# Patient Record
Sex: Male | Born: 1990 | Race: White | Hispanic: No | Marital: Married | Smoking: Never smoker
Health system: Southern US, Community
[De-identification: ages and names within clinical notes are randomized; demographics above are authoritative.]

## PROBLEM LIST (undated history)

## (undated) HISTORY — PX: INNER EAR SURGERY: SHX679

## (undated) HISTORY — PX: WISDOM TOOTH EXTRACTION: SHX21

---
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
## 2009-04-14 ENCOUNTER — Ambulatory Visit: Payer: Self-pay | Admitting: Family Medicine

## 2009-04-14 ENCOUNTER — Encounter: Payer: Self-pay | Admitting: Family Medicine

## 2009-04-14 DIAGNOSIS — R5381 Other malaise: Secondary | ICD-10-CM

## 2009-04-14 DIAGNOSIS — R5383 Other fatigue: Secondary | ICD-10-CM

## 2009-04-14 LAB — CONVERTED CEMR LAB
ALT: 15 units/L (ref 0–53)
AST: 17 units/L (ref 0–37)
Albumin: 4.6 g/dL (ref 3.5–5.2)
Calcium: 9.2 mg/dL (ref 8.4–10.5)
Chloride: 105 meq/L (ref 96–112)
Creatinine, Ser: 1.05 mg/dL (ref 0.40–1.50)
Heterophile Ab Screen: NEGATIVE
Lymphocytes Relative: 33 % (ref 12–46)
Lymphs Abs: 2.1 10*3/uL (ref 0.7–4.0)
Monocytes Relative: 10 % (ref 3–12)
Neutro Abs: 3.4 10*3/uL (ref 1.7–7.7)
Neutrophils Relative %: 53 % (ref 43–77)
Platelets: 250 10*3/uL (ref 150–400)
Potassium: 3.9 meq/L (ref 3.5–5.3)
RBC: 4.8 M/uL (ref 4.22–5.81)
Sodium: 140 meq/L (ref 135–145)
Total Protein: 6.9 g/dL (ref 6.0–8.3)
WBC: 6.4 10*3/uL (ref 4.0–10.5)

## 2010-02-08 NOTE — Assessment & Plan Note (Signed)
Summary: sore throat/cough/gums bleeding/eo   Vital Signs:  Patient profile:   20 year old male Weight:      141 pounds Temp:     97.8 degrees F oral Pulse rate:   93 / minute BP sitting:   129 / 75  (left arm)  Vitals Entered By: Renato Battles slade,cma CC: cough and congestion. fatigue over the last 2 weeks. tired x 1 month. bleeding gums x 3 weeks. Is Patient Diabetic? No Pain Assessment Patient in pain? no        Primary Care Provider:  Enid Baas MD  CC:  cough and congestion. fatigue over the last 2 weeks. tired x 1 month. bleeding gums x 3 weeks.Marland Kitchen  History of Present Illness: Sick for 2 weeks, with cough, congestion, sore throat, fatigue and bleeding gums.  Deneis fever or chills. Goes to [employer] full time, in a fraturnity, works double shifts.  Visits the dentist every 6 months, teeth are in good repair.  Deneis smoking pot, smokes cigs and does drink beer.    Habits & Providers  Alcohol-Tobacco-Diet     Tobacco Status: current  Comments: sometimes.   Current Medications (verified): 1)  Amoxicillin 500 Mg Caps (Amoxicillin) .... One Tab Three Times A Day For 7 Days 2)  Hydromet 5-1.5 Mg/33ml Syrp (Hydrocodone-Homatropine) .... One Teaspoonful Three Times A Day As Needed For Severe Cough, 100 Cc  Allergies (verified): No Known Drug Allergies  Social History: Smoking Status:  current  Review of Systems General:  Complains of fatigue and loss of appetite; denies chills, fever, sweats, and weight loss. ENT:  Complains of nasal congestion, postnasal drainage, and sore throat. CV:  Denies chest pain or discomfort and difficulty breathing at night. Resp:  Complains of cough; denies shortness of breath and wheezing. GI:  Denies abdominal pain, diarrhea, nausea, and vomiting.  Physical Exam  General:  Well developed, alert, congested. Ears:  TM pink and retracted Nose:  inflammed Mouth:  reddened, non-exudative tonsils Neck:  no adenopathy Lungs:  normal respiratory  effort and normal breath sounds.   Heart:  normal rate and regular rhythm.   Abdomen:  soft, non-tender, normal bowel sounds, no hepatomegaly, and no splenomegaly.   Skin:  no rashes Cervical Nodes:  No lymphadenopathy noted Axillary Nodes:  No palpable lymphadenopathy   Impression & Recommendations:  Problem # 1:  FATIGUE (ICD-780.79) negative monospot, likely prolong viral illness with secondary infection in young man with a very busy life.  Will check labs. Orders: CBC w/Diff-[facility] (16109) Comp Met-[facility] (60454-09811) [facility]- Est  Level 4 (91478)  Problem # 2:  SINUSITIS, ACUTE (ICD-461.9)  His updated medication list for this problem includes:    Amoxicillin 500 Mg Caps (Amoxicillin) ..... One tab three times a day for 7 days    Hydromet 5-1.5 Mg/41ml Syrp (Hydrocodone-homatropine) ..... One teaspoonful three times a day as needed for severe cough, 100 cc  Orders: [facility]- Est  Level 4 (29562)  Problem # 3:  BLEEDING GUMS (ICD-523.8) likely gingivits, check for thrombocytopenia Orders: [facility]- Est  Level 4 (13086)  Complete Medication List: 1)  Amoxicillin 500 Mg Caps (Amoxicillin) .... One tab three times a day for 7 days 2)  Hydromet 5-1.5 Mg/48ml Syrp (Hydrocodone-homatropine) .... One teaspoonful three times a day as needed for severe cough, 100 cc  Other Orders: Monospot-[facility] (57846)  Patient Instructions: 1)  [id number] 2)  [id number] Text Mat 3)  Mono was negative 4)  Return as needed 5)  Use Listerine for bleeding gums-  6)  I will call with labs  Prescriptions: HYDROMET 5-1.5 MG/5ML SYRP (HYDROCODONE-HOMATROPINE) one teaspoonful three times a day as needed for severe cough, 100 cc Brand medically necessary #1 x 0   Entered and Authorized by:   Luretha Murphy NP   Signed by:   Luretha Murphy NP on 04/14/2009   Method used:   Print then Give to Patient   RxID:   [id number] AMOXICILLIN 500 MG CAPS (AMOXICILLIN) one tab three times a day for 7 days Brand medically  necessary #21 x 0   Entered and Authorized by:   Luretha Murphy NP   Signed by:   Luretha Murphy NP on 04/14/2009   Method used:   Print then Give to Patient   RxID:   [id number]   Laboratory Results   Blood Tests   Date/Time Received: April 14, 2009 10:26 AM  Date/Time Reported: April 14, 2009 10:41 AM    Mono: negative Comments: ...........test performed by...........Marland KitchenTerese Door, CMA

## 2019-04-23 ENCOUNTER — Ambulatory Visit: Payer: Self-pay | Attending: Internal Medicine

## 2019-04-23 DIAGNOSIS — Z23 Encounter for immunization: Secondary | ICD-10-CM

## 2019-05-17 ENCOUNTER — Ambulatory Visit: Payer: Self-pay | Attending: Internal Medicine

## 2019-05-17 DIAGNOSIS — Z23 Encounter for immunization: Secondary | ICD-10-CM

## 2019-05-17 NOTE — Progress Notes (Signed)
   Covid-19 Vaccination [facility]  Name:  Cory Powell    MRN: [id number] DOB: 01-04-1991  05/17/2019  [name] was observed post Covid-19 immunization for 15 minutes without incident. He was provided with Vaccine Information Sheet and instruction to access the V-Safe system.   [name] was instructed to call 911 with any severe reactions post vaccine: Marland Kitchen Difficulty breathing  . Swelling of face and throat  . A fast heartbeat  . A bad rash all over body  . Dizziness and weakness   Immunizations Administered    Name Date Dose VIS Date Route   Pfizer COVID-19 Vaccine 05/17/2019  2:47 PM 0.3 mL 03/03/2018 Intramuscular   Manufacturer: [place]   Lot: [id number]   [place]: [id number]

## 2019-07-16 ENCOUNTER — Other Ambulatory Visit: Payer: Self-pay

## 2019-07-16 ENCOUNTER — Encounter: Payer: Self-pay | Admitting: Family Medicine

## 2019-07-16 ENCOUNTER — Ambulatory Visit (INDEPENDENT_AMBULATORY_CARE_PROVIDER_SITE_OTHER): Payer: 59 | Admitting: Family Medicine

## 2019-07-16 VITALS — BP 146/69 | HR 84 | Ht 66.5 in | Wt 173.0 lb

## 2019-07-16 DIAGNOSIS — Z23 Encounter for immunization: Secondary | ICD-10-CM

## 2019-07-16 DIAGNOSIS — Z13220 Encounter for screening for lipoid disorders: Secondary | ICD-10-CM

## 2019-07-16 DIAGNOSIS — Z0000 Encounter for general adult medical examination without abnormal findings: Secondary | ICD-10-CM | POA: Insufficient documentation

## 2019-07-16 NOTE — Assessment & Plan Note (Signed)
Well adult Orders Placed This Encounter  Procedures  . Tdap vaccine greater than or equal to 30yo IM  . COMPLETE METABOLIC PANEL WITH GFR  . CBC  . Lipid Profile  Screening: Lipid Immunizations: Updated Tdap given Anticipatory guidance/Risk factor reduction:  Recommendations per AVS

## 2019-07-16 NOTE — Progress Notes (Signed)
Cory Powell - 29 y.o. male MRN [id number]  Date of birth: 09/01/1990  Subjective Chief Complaint  Patient presents with  . Establish Care    HPI Cory Powell is a 29 y.o. male here today for initial visit and annual exam.  He has been in good health.  He does not take any chronic medications.  He does use marijuana 2-3x per month.  He denies use of nicotine products.  He consumes a couple of servings of EtOH on the weekend.  He exercises a few days per week.  Feels like diet could be improved.  Some stress related to work but handling ok.    Father had colon cancer diagnosed at age 51.  No other cancers or significant medical problems in the family.   Review of Systems  Constitutional: Negative for chills, fever, malaise/fatigue and weight loss.  HENT: Negative for congestion, ear pain and sore throat.   Eyes: Negative for blurred vision, double vision and pain.  Respiratory: Negative for cough and shortness of breath.   Cardiovascular: Negative for chest pain and palpitations.  Gastrointestinal: Negative for abdominal pain, blood in stool, constipation, heartburn and nausea.  Genitourinary: Negative for dysuria and urgency.  Musculoskeletal: Negative for joint pain and myalgias.  Neurological: Negative for dizziness and headaches.  Endo/Heme/Allergies: Does not bruise/bleed easily.  Psychiatric/Behavioral: Negative for depression. The patient is not nervous/anxious and does not have insomnia.     No Known Allergies  History reviewed. No pertinent past medical history.  History reviewed. No pertinent surgical history.  Social History   Socioeconomic History  . Marital status: Married    Spouse name: Not on file  . Number of children: Not on file  . Years of education: Not on file  . Highest education level: Not on file  Occupational History  . Occupation: Herbalist: [employer]  Tobacco Use  . Smoking status: Never Smoker  . Smokeless tobacco: Never Used   Vaping Use  . Vaping Use: Never used  Substance and Sexual Activity  . Alcohol use: Yes    Comment: 23- drinks per week  . Drug use: Yes    Types: Marijuana    Comment: 2x per month  . Sexual activity: Yes  Other Topics Concern  . Not on file  Social History Narrative  . Not on file   Social Determinants of Health   Financial Resource Strain:   . Difficulty of Paying Living Expenses:   Food Insecurity:   . Worried About Charity fundraiser in the Last Year:   . Arboriculturist in the Last Year:   Transportation Needs:   . Film/video editor (Medical):   Marland Kitchen Lack of Transportation (Non-Medical):   Physical Activity:   . Days of Exercise per Week:   . Minutes of Exercise per Session:   Stress:   . Feeling of Stress :   Social Connections:   . Frequency of Communication with Friends and Family:   . Frequency of Social Gatherings with Friends and Family:   . Attends Religious Services:   . Active Member of Clubs or Organizations:   . Attends Archivist Meetings:   Marland Kitchen Marital Status:     Family History  Problem Relation Age of Onset  . Colon cancer Father 103    Health Maintenance  Topic Date Due  . Hepatitis C Screening  Never done  . HIV Screening  Never done  . INFLUENZA VACCINE  08/08/2019  .  TETANUS/TDAP  07/15/2029  . COVID-19 Vaccine  Completed     ----------------------------------------------------------------------------------------------------------------------------------------------------------------------------------------------------------------- Physical Exam BP (!) 146/69   Pulse 84   Ht 5' 6.5" (1.689 m)   Wt 173 lb (78.5 kg)   SpO2 (!) 84%   BMI 27.50 kg/m   Physical Exam Constitutional:      General: He is not in acute distress. HENT:     Head: Normocephalic and atraumatic.     Right Ear: External ear normal.     Left Ear: External ear normal.  Eyes:     General: No scleral icterus. Neck:     Thyroid: No thyromegaly.   Cardiovascular:     Rate and Rhythm: Normal rate and regular rhythm.     Heart sounds: Normal heart sounds.  Pulmonary:     Effort: Pulmonary effort is normal.     Breath sounds: Normal breath sounds.  Abdominal:     General: Bowel sounds are normal. There is no distension.     Palpations: Abdomen is soft.     Tenderness: There is no abdominal tenderness. There is no guarding.  Musculoskeletal:     Cervical back: Normal range of motion.  Lymphadenopathy:     Cervical: No cervical adenopathy.  Skin:    General: Skin is warm and dry.     Findings: No rash.  Neurological:     Mental Status: He is alert and oriented to person, place, and time.     Cranial Nerves: No cranial nerve deficit.     Motor: No abnormal muscle tone.  Psychiatric:        Mood and Affect: Mood normal.        Behavior: Behavior normal.     ------------------------------------------------------------------------------------------------------------------------------------------------------------------------------------------------------------------- Assessment and Plan  Well adult exam Well adult Orders Placed This Encounter  Procedures  . Tdap vaccine greater than or equal to 7yo IM  . COMPLETE METABOLIC PANEL WITH GFR  . CBC  . Lipid Profile  Screening: Lipid Immunizations: Updated Tdap given Anticipatory guidance/Risk factor reduction:  Recommendations per AVS   No orders of the defined types were placed in this encounter.   No follow-ups on file.    This visit occurred during the SARS-CoV-2 public health emergency.  Safety protocols were in place, including screening questions prior to the visit, additional usage of staff PPE, and extensive cleaning of exam room while observing appropriate contact time as indicated for disinfecting solutions.

## 2019-07-16 NOTE — Patient Instructions (Signed)
Great to meet you! Stop in to have labs completed in the next couple of weeks.  Follow up as needed.    Preventive Care 95-30 Years Old, Male Preventive care refers to lifestyle choices and visits with your health care provider that can promote health and wellness. This includes:  A yearly physical exam. This is also called an annual well check.  Regular dental and eye exams.  Immunizations.  Screening for certain conditions.  Healthy lifestyle choices, such as eating a healthy diet, getting regular exercise, not using drugs or products that contain nicotine and tobacco, and limiting alcohol use. What can I expect for my preventive care visit? Physical exam Your health care provider will check:  Height and weight. These may be used to calculate body mass index (BMI), which is a measurement that tells if you are at a healthy weight.  Heart rate and blood pressure.  Your skin for abnormal spots. Counseling Your health care provider may ask you questions about:  Alcohol, tobacco, and drug use.  Emotional well-being.  Home and relationship well-being.  Sexual activity.  Eating habits.  Work and work Statistician. What immunizations do I need?  Influenza (flu) vaccine  This is recommended every year. Tetanus, diphtheria, and pertussis (Tdap) vaccine  You may need a Td booster every 10 years. Varicella (chickenpox) vaccine  You may need this vaccine if you have not already been vaccinated. Human papillomavirus (HPV) vaccine  If recommended by your health care provider, you may need three doses over 6 months. Measles, mumps, and rubella (MMR) vaccine  You may need at least one dose of MMR. You may also need a second dose. Meningococcal conjugate (MenACWY) vaccine  One dose is recommended if you are 60-44 years old and a Market researcher living in a residence hall, or if you have one of several medical conditions. You may also need additional booster  doses. Pneumococcal conjugate (PCV13) vaccine  You may need this if you have certain conditions and were not previously vaccinated. Pneumococcal polysaccharide (PPSV23) vaccine  You may need one or two doses if you smoke cigarettes or if you have certain conditions. Hepatitis A vaccine  You may need this if you have certain conditions or if you travel or work in places where you may be exposed to hepatitis A. Hepatitis B vaccine  You may need this if you have certain conditions or if you travel or work in places where you may be exposed to hepatitis B. Haemophilus influenzae type b (Hib) vaccine  You may need this if you have certain risk factors. You may receive vaccines as individual doses or as more than one vaccine together in one shot (combination vaccines). Talk with your health care provider about the risks and benefits of combination vaccines. What tests do I need? Blood tests  Lipid and cholesterol levels. These may be checked every 5 years starting at age 36.  Hepatitis C test.  Hepatitis B test. Screening   Diabetes screening. This is done by checking your blood sugar (glucose) after you have not eaten for a while (fasting).  Sexually transmitted disease (STD) testing. Talk with your health care provider about your test results, treatment options, and if necessary, the need for more tests. Follow these instructions at home: Eating and drinking   Eat a diet that includes fresh fruits and vegetables, whole grains, lean protein, and low-fat dairy products.  Take vitamin and mineral supplements as recommended by your health care provider.  Do not drink  alcohol if your health care provider tells you not to drink.  If you drink alcohol: ? Limit how much you have to 0-2 drinks a day. ? Be aware of how much alcohol is in your drink. In the [place] one drink equals one 12 oz bottle of beer (355 mL), one 5 oz glass of wine (148 mL), or one 1 oz glass of hard liquor (44  mL). Lifestyle  Take daily care of your teeth and gums.  Stay active. Exercise for at least 30 minutes on 5 or more days each week.  Do not use any products that contain nicotine or tobacco, such as cigarettes, e-cigarettes, and chewing tobacco. If you need help quitting, ask your health care provider.  If you are sexually active, practice safe sex. Use a condom or other form of protection to prevent STIs (sexually transmitted infections). What's next?  Go to your health care provider once a year for a well check visit.  Ask your health care provider how often you should have your eyes and teeth checked.  Stay up to date on all vaccines. This information is not intended to replace advice given to you by your health care provider. Make sure you discuss any questions you have with your health care provider. Document Revised: 12/18/2017 Document Reviewed: 12/18/2017 Elsevier Patient Education  2020 [employer].

## 2019-09-24 ENCOUNTER — Encounter: Payer: Self-pay | Admitting: Family Medicine

## 2019-09-24 ENCOUNTER — Ambulatory Visit (INDEPENDENT_AMBULATORY_CARE_PROVIDER_SITE_OTHER): Payer: 59

## 2019-09-24 ENCOUNTER — Ambulatory Visit (INDEPENDENT_AMBULATORY_CARE_PROVIDER_SITE_OTHER): Payer: 59 | Admitting: Family Medicine

## 2019-09-24 ENCOUNTER — Other Ambulatory Visit: Payer: Self-pay

## 2019-09-24 VITALS — BP 130/77 | HR 85 | Wt 167.1 lb

## 2019-09-24 DIAGNOSIS — N50812 Left testicular pain: Secondary | ICD-10-CM | POA: Diagnosis not present

## 2019-09-24 DIAGNOSIS — N5089 Other specified disorders of the male genital organs: Secondary | ICD-10-CM

## 2019-09-24 NOTE — Progress Notes (Signed)
Cory Powell - 29 y.o. male MRN [id number]  Date of birth: 1990-05-29  Subjective No chief complaint on file.   HPI Cory Powell is a 29 y.o. male here today with complaint of testicular nodule and pain.  He noticed this about 2 weeks ago.  Area is somewhat tender to palpation.  He denies any known trauma.  He has not had pain with urination, ejaculation or erections.  He denies penile discharge.  He has not had cough, shortness of breath or weight loss.   ROS:  A comprehensive ROS was completed and negative except as noted per HPI  No Known Allergies  No past medical history on file.  No past surgical history on file.  Social History   Socioeconomic History  . Marital status: Married    Spouse name: Not on file  . Number of children: Not on file  . Years of education: Not on file  . Highest education level: Not on file  Occupational History  . Occupation: Herbalist: [employer]  Tobacco Use  . Smoking status: Never Smoker  . Smokeless tobacco: Never Used  Vaping Use  . Vaping Use: Never used  Substance and Sexual Activity  . Alcohol use: Yes    Comment: 23- drinks per week  . Drug use: Yes    Types: Marijuana    Comment: 2x per month  . Sexual activity: Yes  Other Topics Concern  . Not on file  Social History Narrative  . Not on file   Social Determinants of Health   Financial Resource Strain:   . Difficulty of Paying Living Expenses: Not on file  Food Insecurity:   . Worried About Charity fundraiser in the Last Year: Not on file  . Ran Out of Food in the Last Year: Not on file  Transportation Needs:   . Lack of Transportation (Medical): Not on file  . Lack of Transportation (Non-Medical): Not on file  Physical Activity:   . Days of Exercise per Week: Not on file  . Minutes of Exercise per Session: Not on file  Stress:   . Feeling of Stress : Not on file  Social Connections:   . Frequency of Communication with Friends and Family: Not on  file  . Frequency of Social Gatherings with Friends and Family: Not on file  . Attends Religious Services: Not on file  . Active Member of Clubs or Organizations: Not on file  . Attends Archivist Meetings: Not on file  . Marital Status: Not on file    Family History  Problem Relation Age of Onset  . Colon cancer Father 79    Health Maintenance  Topic Date Due  . Hepatitis C Screening  Never done  . HIV Screening  Never done  . INFLUENZA VACCINE  Never done  . TETANUS/TDAP  07/15/2029  . COVID-19 Vaccine  Completed     ----------------------------------------------------------------------------------------------------------------------------------------------------------------------------------------------------------------- Physical Exam BP 130/77 (BP Location: Left Arm, Patient Position: Sitting, Cuff Size: Normal)   Pulse 85   Wt 167 lb 1.6 oz (75.8 kg)   SpO2 100%   BMI 26.57 kg/m   Physical Exam Constitutional:      Appearance: Normal appearance.  Genitourinary:    Comments: Nodular area along posterior of L testicle.  No swelling.  There is some tenderness along the nodule.    No hernias noted.  No epididymal tenderness.  Skin:    General: Skin is warm and dry.  Neurological:     Mental Status: He is alert.     ------------------------------------------------------------------------------------------------------------------------------------------------------------------------------------------------------------------- Assessment and Plan  Testicular nodule  No other symptoms suggestive of STI and no epididymal tenderness.  Testicular US ordered for further evaluation of nodule.   No orders of the defined types were placed in this encounter.   No follow-ups on file.    This visit occurred during the SARS-CoV-2 public health emergency.  Safety protocols were in place, including screening questions prior to the visit, additional usage of  staff PPE, and extensive cleaning of exam room while observing appropriate contact time as indicated for disinfecting solutions.

## 2019-09-24 NOTE — Assessment & Plan Note (Signed)
No other symptoms suggestive of STI and no epididymal tenderness.  Testicular US ordered for further evaluation of nodule.

## 2019-09-26 ENCOUNTER — Other Ambulatory Visit: Payer: Self-pay | Admitting: Family Medicine

## 2019-09-26 DIAGNOSIS — N5089 Other specified disorders of the male genital organs: Secondary | ICD-10-CM

## 2019-09-28 ENCOUNTER — Encounter: Payer: Self-pay | Admitting: Family Medicine

## 2019-10-01 ENCOUNTER — Telehealth: Payer: Self-pay

## 2019-10-01 NOTE — Telephone Encounter (Signed)
Laquon concerned about his Urology appt date.  He was told by the receptionist that his appt was scheduled for today, 10/01/19. Upon his arrival, he was told his appt is scheduled for 10/22/19.  Question: keep the 10/22/19 appt or change Urologists for closer appt date?

## 2019-10-01 NOTE — Telephone Encounter (Signed)
Per Dr. Zigmund Daniel,  Please update referral to Urgent.   We've spoken to [facility] and they should be able to schedule him with Dr. Lucretia Field.  Send new referral to [facility] Phone: [phone] Fax [phone]  Please let us know the appointment date ASAP and we will contact the patient.   Thank you

## 2019-10-01 NOTE — Telephone Encounter (Signed)
Referral faxed to [facility] urology [facility] as Urgent! - CF

## 2019-10-01 NOTE — Telephone Encounter (Signed)
Let's check with [facility] to see if they can get him in any sooner.

## 2019-10-06 ENCOUNTER — Encounter: Payer: Self-pay | Admitting: Family Medicine

## 2019-10-06 NOTE — Telephone Encounter (Signed)
The referral was sent to [facility] originally yes so if he did not cancel his appointment with them everything is good and he just needs to show up. - CF

## 2019-10-13 ENCOUNTER — Other Ambulatory Visit: Payer: Self-pay | Admitting: Urology

## 2019-10-14 NOTE — Patient Instructions (Signed)
DUE TO COVID-19 ONLY ONE VISITOR IS ALLOWED TO COME WITH YOU AND STAY IN THE WAITING ROOM ONLY DURING PRE OP AND PROCEDURE DAY OF SURGERY. THE 1 VISITOR  MAY VISIT WITH YOU AFTER SURGERY IN YOUR PRIVATE ROOM DURING VISITING HOURS ONLY!  YOU NEED TO HAVE A COVID 19 TEST ON: 10/15/19 @ 10:45 AM, THIS TEST MUST BE DONE BEFORE SURGERY,  COVID TESTING SITE [place] [address], IT IS ON THE RIGHT GOING OUT [address] APPROXIMATELY  2 MINUTES PAST [employer] ON THE RIGHT. ONCE YOUR COVID TEST IS COMPLETED,  PLEASE BEGIN THE QUARANTINE INSTRUCTIONS AS OUTLINED IN YOUR HANDOUT.                Cory Powell    Your procedure is scheduled on: 10/19/19   Report to [facility] Main  Entrance   Report to admitting at: 10:15 AM     Call this number if you have problems the morning of surgery [phone]    Remember: Do not eat solid food :After Midnight. Clear liquids from midnight until: 9:15 am.  CLEAR LIQUID DIET   Foods Allowed                                                                     Foods Excluded  Coffee and tea, regular and decaf                             liquids that you cannot  Plain Jell-O any favor except red or purple                                           see through such as: Fruit ices (not with fruit pulp)                                     milk, soups, orange juice  Iced Popsicles                                    All solid food Carbonated beverages, regular and diet                                    Cranberry, grape and apple juices Sports drinks like Gatorade Lightly seasoned clear broth or consume(fat free) Sugar, honey syrup  Sample Menu Breakfast                                Lunch                                     Supper Cranberry juice  Beef broth                            Chicken broth Jell-O                                     Grape juice                           Apple  juice Coffee or tea                        Jell-O                                      Popsicle                                                Coffee or tea                        Coffee or tea  _____________________________________________________________________   BRUSH YOUR TEETH MORNING OF SURGERY AND RINSE YOUR MOUTH OUT, NO CHEWING GUM CANDY OR MINTS.                     You may not have any metal on your body including hair pins and              piercings  Do not wear jewelry, lotions, powders or perfumes, deodorant             Men may shave face and neck.   Do not bring valuables to the hospital. [place].  Contacts, dentures or bridgework may not be worn into surgery.  Leave suitcase in the car. After surgery it may be brought to your room.     Patients discharged the day of surgery will not be allowed to drive home. IF YOU ARE HAVING SURGERY AND GOING HOME THE SAME DAY, YOU MUST HAVE AN ADULT TO DRIVE YOU HOME AND BE WITH YOU FOR 24 HOURS. YOU MAY GO HOME BY TAXI OR UBER OR ORTHERWISE, BUT AN ADULT MUST ACCOMPANY YOU HOME AND STAY WITH YOU FOR 24 HOURS.  Name and phone number of your driver:  Special Instructions: N/A              Please read over the following fact sheets you were given: _____________________________________________________________________         [facility] - Preparing for Surgery Before surgery, you can play an important role.  Because skin is not sterile, your skin needs to be as free of germs as possible.  You can reduce the number of germs on your skin by washing with CHG (chlorahexidine gluconate) soap before surgery.  CHG is an antiseptic cleaner which kills germs and bonds with the skin to continue killing germs even after washing. Please DO NOT use if you have an allergy to CHG or antibacterial soaps.  If your skin becomes reddened/irritated stop using the CHG  and inform your nurse when you arrive at  Short Stay. Do not shave (including legs and underarms) for at least 48 hours prior to the first CHG shower.  You may shave your face/neck. Please follow these instructions carefully:  1.  Shower with CHG Soap the night before surgery and the  morning of Surgery.  2.  If you choose to wash your hair, wash your hair first as usual with your  normal  shampoo.  3.  After you shampoo, rinse your hair and body thoroughly to remove the  shampoo.                           4.  Use CHG as you would any other liquid soap.  You can apply chg directly  to the skin and wash                       Gently with a scrungie or clean washcloth.  5.  Apply the CHG Soap to your body ONLY FROM THE NECK DOWN.   Do not use on face/ open                           Wound or open sores. Avoid contact with eyes, ears mouth and genitals (private parts).                       Wash face,  Genitals (private parts) with your normal soap.             6.  Wash thoroughly, paying special attention to the area where your surgery  will be performed.  7.  Thoroughly rinse your body with warm water from the neck down.  8.  DO NOT shower/wash with your normal soap after using and rinsing off  the CHG Soap.                9.  Pat yourself dry with a clean towel.            10.  Wear clean pajamas.            11.  Place clean sheets on your bed the night of your first shower and do not  sleep with pets. Day of Surgery : Do not apply any lotions/deodorants the morning of surgery.  Please wear clean clothes to the hospital/surgery center.  FAILURE TO FOLLOW THESE INSTRUCTIONS MAY RESULT IN THE CANCELLATION OF YOUR SURGERY PATIENT SIGNATURE_________________________________  NURSE SIGNATURE__________________________________  ________________________________________________________________________

## 2019-10-15 ENCOUNTER — Encounter (HOSPITAL_COMMUNITY): Payer: Self-pay

## 2019-10-15 ENCOUNTER — Encounter (HOSPITAL_COMMUNITY)
Admission: RE | Admit: 2019-10-15 | Discharge: 2019-10-15 | Disposition: A | Discharge status: Home / Self Care | Payer: 59 | Source: Ambulatory Visit | Attending: Urology | Admitting: Urology

## 2019-10-15 ENCOUNTER — Other Ambulatory Visit: Payer: Self-pay

## 2019-10-15 ENCOUNTER — Other Ambulatory Visit (HOSPITAL_COMMUNITY)
Admission: RE | Admit: 2019-10-15 | Discharge: 2019-10-15 | Disposition: A | Discharge status: Home / Self Care | Payer: 59 | Source: Ambulatory Visit | Attending: Urology | Admitting: Urology

## 2019-10-15 DIAGNOSIS — Z01812 Encounter for preprocedural laboratory examination: Secondary | ICD-10-CM | POA: Insufficient documentation

## 2019-10-15 DIAGNOSIS — Z20822 Contact with and (suspected) exposure to covid-19: Secondary | ICD-10-CM | POA: Insufficient documentation

## 2019-10-15 LAB — CBC
HCT: 41.6 % (ref 39.0–52.0)
Hemoglobin: 14.1 g/dL (ref 13.0–17.0)
MCH: 30.1 pg (ref 26.0–34.0)
MCHC: 33.9 g/dL (ref 30.0–36.0)
MCV: 88.7 fL (ref 80.0–100.0)
Platelets: 226 10*3/uL (ref 150–400)
RBC: 4.69 MIL/uL (ref 4.22–5.81)
RDW: 11.9 % (ref 11.5–15.5)
WBC: 6.4 10*3/uL (ref 4.0–10.5)
nRBC: 0 % (ref 0.0–0.2)

## 2019-10-15 LAB — SARS CORONAVIRUS 2 (TAT 6-24 HRS): SARS Coronavirus 2: NEGATIVE

## 2019-10-15 NOTE — Progress Notes (Signed)
COVID Vaccine Completed: Yes Date COVID Vaccine completed: 05/17/19 COVID vaccine manufacturer: Pfizer     PCP - Dr. [name] Cardiologist -   Chest x-ray -  EKG -  Stress Test -  ECHO -  Cardiac Cath -  Pacemaker/ICD device last checked:  Sleep Study -  CPAP -   Fasting Blood Sugar -  Checks Blood Sugar _____ times a day  Blood Thinner Instructions: Aspirin Instructions: Last Dose:  Anesthesia review:   Patient denies shortness of breath, fever, cough and chest pain at PAT appointment   Patient verbalized understanding of instructions that were given to them at the PAT appointment. Patient was also instructed that they will need to review over the PAT instructions again at home before surgery.

## 2019-10-18 NOTE — Anesthesia Preprocedure Evaluation (Addendum)
Anesthesia Evaluation  Patient identified by MRN, date of birth, ID band Patient awake    Reviewed: Allergy & Precautions, NPO status , Patient's Chart, lab work & pertinent test results  History of Anesthesia Complications Negative for: history of anesthetic complications  Airway Mallampati: II  TM Distance: >3 FB Neck ROM: Full    Dental no notable dental hx. (+) Dental Advisory Given   Pulmonary neg pulmonary ROS,    Pulmonary exam normal        Cardiovascular negative cardio ROS Normal cardiovascular exam     Neuro/Psych negative neurological ROS     GI/Hepatic negative GI ROS, Neg liver ROS,   Endo/Other  negative endocrine ROS  Renal/GU negative Renal ROS     Musculoskeletal negative musculoskeletal ROS (+)   Abdominal   Peds  Hematology negative hematology ROS (+)   Anesthesia Other Findings   Reproductive/Obstetrics                            Anesthesia Physical Anesthesia Plan  ASA: II  Anesthesia Plan: General   Post-op Pain Management:    Induction: Intravenous  PONV Risk Score and Plan: 4 or greater and Ondansetron, Dexamethasone, Midazolam and Scopolamine patch - Pre-op  Airway Management Planned: LMA  Additional Equipment:   Intra-op Plan:   Post-operative Plan: Extubation in OR  Informed Consent: I have reviewed the patients History and Physical, chart, labs and discussed the procedure including the risks, benefits and alternatives for the proposed anesthesia with the patient or authorized representative who has indicated his/her understanding and acceptance.     Dental advisory given  Plan Discussed with: Anesthesiologist and CRNA  Anesthesia Plan Comments:       Anesthesia Quick Evaluation

## 2019-10-18 NOTE — H&P (Signed)
I have swelling in my scrotum.  HPI: Cory Powell is a 29 year-old male patient who was referred by Dr. Perlie Mayo, DO who is here for scrotal swelling.  The mass is on the left side.   Cory Powell is a 29 yo male who is sent in consultation by Dr. [name] for a left testicular mass that was painless and noted on self exam. He had no associated symptoms. 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 He has no prior GU history. He has no associated signs or symptoms.      ALLERGIES: None   MEDICATIONS: None   GU PSH: None     PSH Notes: tubes in ears as a child   NON-GU PSH: None   GU PMH: None   NON-GU PMH: None   FAMILY HISTORY: Colon Cancer - Father   SOCIAL HISTORY: Marital Status: Married Preferred Language: English; Race: White Current Smoking Status: Patient does not smoke anymore. Has not smoked since 10/07/2009. Smoked for 1 year.   Tobacco Use Assessment Completed: Used Tobacco in last 30 days? Drinks 3 drinks per week. Types of alcohol consumed: Beer.  Drinks 3 caffeinated drinks per day. Patient's occupation Engineer, site.     Notes: No children   REVIEW OF SYSTEMS:    GU Review Male:   Patient denies frequent urination, hard to postpone urination, burning/ pain with urination, get up at night to urinate, leakage of urine, stream starts and stops, trouble starting your stream, have to strain to urinate , erection problems, and penile pain.  Gastrointestinal (Upper):   Patient denies nausea, vomiting, and indigestion/ heartburn.  Gastrointestinal (Lower):   Patient denies diarrhea and constipation.  Constitutional:   Patient denies fever, night sweats, weight loss, and fatigue.  Skin:   Patient denies skin rash/ lesion and itching.  Eyes:   Patient denies blurred vision and double vision.  Ears/ Nose/ Throat:   Patient denies sore throat and sinus problems.   Hematologic/Lymphatic:   Patient denies swollen glands and easy bruising.  Cardiovascular:   Patient denies leg swelling and chest pains.  Respiratory:   Patient denies cough and shortness of breath.  Endocrine:   Patient denies excessive thirst.  Musculoskeletal:   Patient denies back pain and joint pain.  Neurological:   Patient denies headaches and dizziness.  Psychologic:   Patient denies depression and anxiety.   VITAL SIGNS:      10/13/2019 10:58 AM  Weight 170 lb / 77.11 kg  Height 66 in / 167.64 cm  BP 115/78 mmHg  Pulse 76 /min  Temperature 98.4 F / 36.8 C  BMI 27.4 kg/m   GU PHYSICAL EXAMINATION:    Scrotum: No lesions. No edema. No cysts. No warts.  Epididymides: Right: no spermatocele, no masses, no cysts, no tenderness, no induration, no enlargement. Left: no spermatocele, no masses, no cysts, no tenderness, no induration, no enlargement.  Testes: Atrophic left testis. 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 No tenderness, no swelling, no enlargement left testis. No tenderness, no swelling, no enlargement right testis. Normal location left testis. Normal location right testis. No cyst, no varicocele, no hydrocele left testis. No mass, no cyst, no varicocele, no hydrocele right testis.   Urethral Meatus: Normal size. No lesion, no wart, no discharge, no polyp. Normal location.  Penis: Circumcised, no warts, no cracks. No dorsal Peyronie's plaques, no left corporal Peyronie's  plaques, no right corporal Peyronie's plaques, no scarring, no warts. No balanitis, no meatal stenosis.   MULTI-SYSTEM PHYSICAL EXAMINATION:    Constitutional: Well-nourished. No physical deformities. Normally developed. Good grooming.  Neck: Neck symmetrical, not swollen. Normal tracheal position.  Respiratory: Normal breath sounds. No labored breathing, no use of accessory muscles.   Cardiovascular: Regular rate and rhythm. No murmur, no gallop.   Lymphatic: No enlargement, no  tenderness of axillae, groin, neck lymph nodes.  Skin: No paleness, no jaundice, no cyanosis. No lesion, no ulcer, no rash.  Neurologic / Psychiatric: Oriented to time, oriented to place, oriented to person. No depression, no anxiety, no agitation.  Gastrointestinal: No hernia. No mass, no tenderness, no rigidity, non obese abdomen.   Musculoskeletal: Normal gait and station of head and neck.     Complexity of Data:  Records Review:   Previous Doctor Records  Urine Test Review:   Urinalysis  X-Ray Review: Scrotal Ultrasound: Reviewed Films. Reviewed Report. Discussed With Patient.     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 I discussed the implications of this finding and will get him set up for a left radical orchiectomy. I have reviewed the risks of bleeding, infection, nerve injury, reduced testosterone and fertility issues, thrombotic events and anesthetic complications. I have discussed the option of a testicular prosthesis but he is not interested at this time. I discussed possible future therapy and the possible need for sperm banking if chemotherapy is required. I will get Markers and baseline labs today.    PLAN:           Orders Labs CBC with Diff, CMP, Beta HCG, LDH, Alpha Fetoprotein (AFP)          Schedule Return Visit/Planned Activity: ASAP - Schedule Surgery             Note: Left radical orchiectomy.   Procedure: Unspecified Date - Radical Orchiectomy - 00938, left Notes: asap

## 2019-10-19 ENCOUNTER — Other Ambulatory Visit: Payer: Self-pay

## 2019-10-19 ENCOUNTER — Encounter (HOSPITAL_COMMUNITY): Payer: Self-pay | Admitting: Urology

## 2019-10-19 ENCOUNTER — Ambulatory Visit (HOSPITAL_COMMUNITY): Payer: 59 | Admitting: Anesthesiology

## 2019-10-19 ENCOUNTER — Ambulatory Visit (HOSPITAL_COMMUNITY)
Admission: RE | Admit: 2019-10-19 | Discharge: 2019-10-19 | Disposition: A | Discharge status: Home / Self Care | Payer: 59 | Attending: Urology | Admitting: Urology

## 2019-10-19 ENCOUNTER — Encounter (HOSPITAL_COMMUNITY)
Admission: RE | Disposition: A | Discharge status: Home / Self Care | Payer: Self-pay | Source: Home / Self Care | Attending: Urology

## 2019-10-19 DIAGNOSIS — C6292 Malignant neoplasm of left testis, unspecified whether descended or undescended: Secondary | ICD-10-CM | POA: Diagnosis not present

## 2019-10-19 DIAGNOSIS — Z87891 Personal history of nicotine dependence: Secondary | ICD-10-CM | POA: Diagnosis not present

## 2019-10-19 DIAGNOSIS — N509 Disorder of male genital organs, unspecified: Secondary | ICD-10-CM | POA: Diagnosis present

## 2019-10-19 HISTORY — PX: ORCHIECTOMY: SHX2116

## 2019-10-19 SURGERY — ORCHIECTOMY
Anesthesia: General | Laterality: Left

## 2019-10-19 MED ORDER — ACETAMINOPHEN 500 MG PO TABS
1000.0000 mg | ORAL_TABLET | Freq: Once | ORAL | Status: AC
  Administered 2019-10-19: 1000 mg via ORAL
  Filled 2019-10-19: qty 2

## 2019-10-19 MED ORDER — LACTATED RINGERS IV SOLN: INTRAVENOUS | Status: DC

## 2019-10-19 MED ORDER — BUPIVACAINE HCL 0.25 % IJ SOLN
INTRAMUSCULAR | Status: AC
  Filled 2019-10-19: qty 1

## 2019-10-19 MED ORDER — LIDOCAINE 2% (20 MG/ML) 5 ML SYRINGE
INTRAMUSCULAR | Status: DC | PRN
  Administered 2019-10-19: 60 mg via INTRAVENOUS

## 2019-10-19 MED ORDER — ONDANSETRON HCL 4 MG/2ML IJ SOLN
INTRAMUSCULAR | Status: AC
  Filled 2019-10-19: qty 2

## 2019-10-19 MED ORDER — DEXAMETHASONE SODIUM PHOSPHATE 10 MG/ML IJ SOLN
INTRAMUSCULAR | Status: DC | PRN
  Administered 2019-10-19: 10 mg via INTRAVENOUS

## 2019-10-19 MED ORDER — FENTANYL CITRATE (PF) 100 MCG/2ML IJ SOLN: 25.0000 ug | INTRAMUSCULAR | Status: DC | PRN

## 2019-10-19 MED ORDER — LIDOCAINE 2% (20 MG/ML) 5 ML SYRINGE
INTRAMUSCULAR | Status: AC
  Filled 2019-10-19: qty 5

## 2019-10-19 MED ORDER — MIDAZOLAM HCL 2 MG/2ML IJ SOLN
INTRAMUSCULAR | Status: AC
  Filled 2019-10-19: qty 2

## 2019-10-19 MED ORDER — CELECOXIB 200 MG PO CAPS
200.0000 mg | ORAL_CAPSULE | Freq: Once | ORAL | Status: AC
  Administered 2019-10-19: 200 mg via ORAL
  Filled 2019-10-19: qty 1

## 2019-10-19 MED ORDER — GLYCOPYRROLATE PF 0.2 MG/ML IJ SOSY
PREFILLED_SYRINGE | INTRAMUSCULAR | Status: AC
  Filled 2019-10-19: qty 1

## 2019-10-19 MED ORDER — DEXAMETHASONE SODIUM PHOSPHATE 10 MG/ML IJ SOLN
INTRAMUSCULAR | Status: AC
  Filled 2019-10-19: qty 1

## 2019-10-19 MED ORDER — CHLORHEXIDINE GLUCONATE 0.12 % MT SOLN
15.0000 mL | Freq: Once | OROMUCOSAL | Status: AC
  Administered 2019-10-19: 15 mL via OROMUCOSAL

## 2019-10-19 MED ORDER — FENTANYL CITRATE (PF) 100 MCG/2ML IJ SOLN
INTRAMUSCULAR | Status: AC
  Filled 2019-10-19: qty 2

## 2019-10-19 MED ORDER — 0.9 % SODIUM CHLORIDE (POUR BTL) OPTIME
TOPICAL | Status: DC | PRN
  Administered 2019-10-19: 1000 mL

## 2019-10-19 MED ORDER — SODIUM CHLORIDE 0.9% FLUSH: 3.0000 mL | Freq: Two times a day (BID) | INTRAVENOUS | Status: DC

## 2019-10-19 MED ORDER — SCOPOLAMINE 1 MG/3DAYS TD PT72
1.0000 | MEDICATED_PATCH | TRANSDERMAL | Status: DC
  Administered 2019-10-19: 1.5 mg via TRANSDERMAL
  Filled 2019-10-19: qty 1

## 2019-10-19 MED ORDER — PHENYLEPHRINE HCL (PRESSORS) 10 MG/ML IV SOLN
INTRAVENOUS | Status: AC
  Filled 2019-10-19: qty 1

## 2019-10-19 MED ORDER — BUPIVACAINE HCL 0.25 % IJ SOLN
INTRAMUSCULAR | Status: DC | PRN
  Administered 2019-10-19: 10 mL

## 2019-10-19 MED ORDER — PROPOFOL 10 MG/ML IV BOLUS
INTRAVENOUS | Status: AC
  Filled 2019-10-19: qty 20

## 2019-10-19 MED ORDER — ONDANSETRON HCL 4 MG/2ML IJ SOLN
INTRAMUSCULAR | Status: DC | PRN
  Administered 2019-10-19: 4 mg via INTRAVENOUS

## 2019-10-19 MED ORDER — PROPOFOL 500 MG/50ML IV EMUL
INTRAVENOUS | Status: DC | PRN
  Administered 2019-10-19: 200 mg via INTRAVENOUS

## 2019-10-19 MED ORDER — PROMETHAZINE HCL 25 MG/ML IJ SOLN: 6.2500 mg | INTRAMUSCULAR | Status: DC | PRN

## 2019-10-19 MED ORDER — ORAL CARE MOUTH RINSE: 15.0000 mL | Freq: Once | OROMUCOSAL | Status: AC

## 2019-10-19 MED ORDER — HYDROCODONE-ACETAMINOPHEN 5-325 MG PO TABS
1.0000 | ORAL_TABLET | Freq: Four times a day (QID) | ORAL | 0 refills | Status: DC | PRN
Start: 2019-10-19 — End: 2019-11-15

## 2019-10-19 MED ORDER — FENTANYL CITRATE (PF) 100 MCG/2ML IJ SOLN
INTRAMUSCULAR | Status: DC | PRN
  Administered 2019-10-19 (×4): 50 ug via INTRAVENOUS

## 2019-10-19 MED ORDER — MIDAZOLAM HCL 2 MG/2ML IJ SOLN
INTRAMUSCULAR | Status: DC | PRN
  Administered 2019-10-19: 2 mg via INTRAVENOUS

## 2019-10-19 MED ORDER — CEFAZOLIN SODIUM-DEXTROSE 2-4 GM/100ML-% IV SOLN
2.0000 g | INTRAVENOUS | Status: AC
  Administered 2019-10-19: 2 g via INTRAVENOUS
  Filled 2019-10-19: qty 100

## 2019-10-19 SURGICAL SUPPLY — 32 items
ADH SKN CLS APL DERMABOND .7 (GAUZE/BANDAGES/DRESSINGS) ×1
APL SKNCLS STERI-STRIP NONHPOA (GAUZE/BANDAGES/DRESSINGS) ×1
BENZOIN TINCTURE PRP APPL 2/3 (GAUZE/BANDAGES/DRESSINGS) ×3 IMPLANT
BNDG CMPR 82X61 PLY HI ABS (GAUZE/BANDAGES/DRESSINGS) ×1
BNDG CONFORM 6X.82 1P STRL (GAUZE/BANDAGES/DRESSINGS) ×2 IMPLANT
BNDG GAUZE ELAST 4 BULKY (GAUZE/BANDAGES/DRESSINGS) ×3 IMPLANT
COVER WAND RF STERILE (DRAPES) IMPLANT
DECANTER SPIKE VIAL GLASS SM (MISCELLANEOUS) ×3 IMPLANT
DERMABOND ADVANCED (GAUZE/BANDAGES/DRESSINGS) ×2
DERMABOND ADVANCED .7 DNX12 (GAUZE/BANDAGES/DRESSINGS) IMPLANT
DRAIN PENROSE 0.25X18 (DRAIN) IMPLANT
DRAIN PENROSE 0.5X18 (DRAIN) IMPLANT
DRAPE LAPAROTOMY T 98X78 PEDS (DRAPES) ×3 IMPLANT
ELECT REM PT RETURN 15FT ADLT (MISCELLANEOUS) ×3 IMPLANT
GAUZE SPONGE 4X4 12PLY STRL (GAUZE/BANDAGES/DRESSINGS) ×3 IMPLANT
GLOVE SURG SS PI 8.0 STRL IVOR (GLOVE) ×3 IMPLANT
GOWN STRL REUS W/TWL XL LVL3 (GOWN DISPOSABLE) ×3 IMPLANT
KIT BASIN OR (CUSTOM PROCEDURE TRAY) ×3 IMPLANT
KIT TURNOVER KIT A (KITS) IMPLANT
NEEDLE HYPO 22GX1.5 SAFETY (NEEDLE) ×3 IMPLANT
NS IRRIG 1000ML POUR BTL (IV SOLUTION) IMPLANT
PACK GENERAL/GYN (CUSTOM PROCEDURE TRAY) ×3 IMPLANT
SUPPORT SCROTAL LG STRP (MISCELLANEOUS) ×2 IMPLANT
SUPPORTER ATHLETIC LG (MISCELLANEOUS) ×1
SUT CHROMIC 3 0 SH 27 (SUTURE) ×4 IMPLANT
SUT CHROMIC 4 0 SH 27 (SUTURE) IMPLANT
SUT MNCRL AB 4-0 PS2 18 (SUTURE) ×2 IMPLANT
SUT VIC AB 3-0 SH 27 (SUTURE) ×3
SUT VIC AB 3-0 SH 27XBRD (SUTURE) ×1 IMPLANT
SUT VICRYL 0 TIES 12 18 (SUTURE) ×3 IMPLANT
SYR CONTROL 10ML LL (SYRINGE) ×3 IMPLANT
WATER STERILE IRR 1000ML POUR (IV SOLUTION) IMPLANT

## 2019-10-19 NOTE — Discharge Instructions (Addendum)
Orchiectomy, Care After This sheet gives you information about how to care for yourself after your procedure. Your health care provider may also give you more specific instructions. If you have problems or questions, contact your health care provider. What can I expect after the procedure? After the procedure, it is common to have:  Pain.  Bruising.  Blood pooling (hematoma) in the area where your testicles were removed.  Depression or mood changes.  Fatigue.  Hot flashes. Follow these instructions at home: Managing pain and swelling  If directed, put ice on the affected area: ? Put ice in a plastic bag. ? Place a towel between your skin and the bag. ? Leave the ice on for 20 minutes, 2-3 times a day.  Wear scrotal support as told by your health care provider.  To relieve pressure and pain when sitting, you may use a donut cushion if directed by your health care provider. Incision care   Follow instructions from your health care provider about how to take care of your incision. Make sure you: ? Wash your hands with soap and water before you change your bandage (dressing). If soap and water are not available, use hand sanitizer. ? Change your dressing once a day, or as often as told by your health care provider. If the dressing sticks to your incision area:  Use warm, soapy water or hydrogen peroxide to dampen the dressing.  When the dressing becomes loose, lift it from the incision area. Make sure that the incision stays closed. ? Leave stitches (sutures), skin glue, or adhesive strips in place. These skin closures may need to stay in place for 2 weeks or longer. If adhesive strip edges start to loosen and curl up, you may trim the loose edges. Do not remove adhesive strips completely unless your health care provider tells you to do that.  Keep your dressing dry until it has been removed.  Check your incision area every day for signs of infection. Check for: ? More redness,  swelling, or pain. ? More fluid or blood. ? Warmth. ? Pus or a bad smell. Bathing  Do not take baths, swim, or use a hot tub until your health care provider approves. You may start taking showers two days after your procedure.  Do not rub your incision to dry it. Pat the area gently with a clean cloth or let it air-dry. Medicines  Take over-the-counter and prescription medicines only as told by your health care provider.  If you were prescribed an antibiotic medicine, use it as told by your health care provider. Do not stop using the antibiotic even if you start to feel better.  If you had both testicles removed, talk with your health care provider about medicine supplements to replace one of the male hormones (testosterone) that your body will no longer make. Driving  Do not drive for 24 hours if you were given a medicine to help you relax (sedative).  Do not drive or use heavy machinery while taking prescription pain medicines. Activity  Avoid activities that may cause your incision to open, such as jogging, playing sports, and straining with bowel movements. Ask your health care provider what activities are safe for you.  Do not lift anything that is heavier than 10 lb (4.5 kg), or the limit that your health care provider tells you, until he or she says that it is safe.  Do not engage in sexual activity until the area is healed and your health care provider approves.   This could take up to 4 weeks. General instructions  To prevent or treat constipation while you are taking prescription pain medicine, your health care provider may recommend that you: ? Drink enough fluid to keep your urine clear or pale yellow. ? Take over-the-counter or prescription medicines. ? Eat foods that are high in fiber, such as fresh fruits and vegetables, whole grains, and beans. ? Limit foods that are high in fat and processed sugars, such as fried and sweet foods.  Do not use any products that  contain nicotine or tobacco, such as cigarettes and e-cigarettes. If you need help quitting, ask your health care provider.  Keep all follow-up visits as told by your health care provider. This is important. Contact a health care provider if:  You have more pain, swelling, or redness in your genital or groin area.  You have more fluid or blood coming from your incision.  Your incision feels warm to the touch.  You have pus or a bad smell coming from your incision.  You have constipation that is not helped by changing your diet or drinking more fluid.  You develop nausea or vomiting.  You cannot eat or drink without vomiting. Get help right away if:  You have dizziness or nausea that does not go away.  You have trouble breathing.  You have a wet (congested) cough.  You have a fever or shaking chills.  Your incision breaks open after the skin closures have been removed.  You are not able to urinate. Summary  After this procedure, it is most common to have bruising or blood pooling in the area where the testicles were removed.  You should check your incision area every day for signs of infection, such as redness, swelling, pain, fluid, blood, warmth, pus, or a bad smell.  Avoid activities that may cause your incision to open, such as jogging, playing sports, and straining with bowel movements. Ask your health care provider what activities are safe for you.  You should not engage in sexual activity until the area is healed and your health care provider approves. This could take up to 4 weeks.  Men who have both testicles removed may have emotional and physical side effects. Your health care provider can help you with ways to manage those side effects. This information is not intended to replace advice given to you by your health care provider. Make sure you discuss any questions you have with your health care provider. Document Revised: 12/06/2016 Document Reviewed:  11/16/2015 Elsevier Patient Education  2020 [employer].

## 2019-10-19 NOTE — Anesthesia Postprocedure Evaluation (Signed)
Anesthesia Post Note  Patient: Cory Powell  Procedure(s) Performed: RADICAL ORCHIECTOMY (Left )     Patient location during evaluation: PACU Anesthesia Type: General Level of consciousness: awake and alert, oriented and awake Pain management: pain level controlled Vital Signs Assessment: post-procedure vital signs reviewed and stable Respiratory status: spontaneous breathing, nonlabored ventilation and respiratory function stable Cardiovascular status: blood pressure returned to baseline and stable Postop Assessment: no apparent nausea or vomiting Anesthetic complications: no   No complications documented.  Last Vitals:  Vitals:   10/19/19 1245 10/19/19 1315  BP: 134/87 132/89  Pulse: 82 (!) 59  Resp: 16 11  Temp: 36.7 C   SpO2: 100% 99%    Last Pain:  Vitals:   10/19/19 1315  TempSrc:   PainSc: 0-No pain                 Catalina Gravel

## 2019-10-19 NOTE — Anesthesia Procedure Notes (Signed)
Procedure Name: LMA Insertion Date/Time: 10/19/2019 11:22 AM Performed by: Gerald Leitz, CRNA Pre-anesthesia Checklist: Patient identified, Patient being monitored, Timeout performed, Emergency Drugs available and Suction available Patient Re-evaluated:Patient Re-evaluated prior to induction Oxygen Delivery Method: Circle system utilized Preoxygenation: Pre-oxygenation with 100% oxygen Induction Type: IV induction Ventilation: Mask ventilation without difficulty LMA: LMA inserted and LMA flexible inserted LMA Size: 4.0 Tube type: Oral Number of attempts: 1 Placement Confirmation: positive ETCO2 and breath sounds checked- equal and bilateral Tube secured with: Tape Dental Injury: Teeth and Oropharynx as per pre-operative assessment

## 2019-10-19 NOTE — Op Note (Signed)
Procedure: Left radical orchiectomy.  Preop diagnosis: Left testicular mass.  Postop diagnosis: Same.  Surgeon: Dr. [name].  Anesthesia: General.  Specimen: Left testicle and cord.  Drain: None.  EBL: 2 mL.  Complications: None.  Indications: Cory Powell is a 29 year old male who detected a mass in the left testicle a few weeks ago.  Recent scrotal ultrasound demonstrated a suspicious mass in the upper pole of the left testicle concerning for neoplasm.  Tumor markers were obtained.  The LDH was mildly elevated at 331.  Alpha-fetoprotein was minimally elevated at 6.9.  The beta-hCG was normal.  He is to undergo left radical orchiectomy for diagnosis and treatment.  Procedure: In the holding area the laterality of the lesion was confirmed with the patient and he was appropriately marked.  He was taken operating room where he was given 2 g of Ancef.  He was placed in the supine position and general anesthetic was induced.  He was fitted with PAS hose.  His left groin was clipped.  He was prepped with Betadine and draped in usual sterile fashion.  Palpation of left testicle was performed once again to confirm the laterality of the mass which was indeed palpable in the upper portion of the testicle.  A 5 cm incision was then made along the skin lines over the inguinal canal.  The subcutaneous tissues were incised with the Bovie exposing the external oblique fascia.  The external ring was identified and the fascia was incised along the length of the canal.  The cord and the ileal inguinal nerve were identified.  The nerve was somewhat adherent to the cremasteric muscles but was gently dissected out and protected.  The cord was then isolated and 1/2 inch Penrose drain was used to occlude the blood supply.  The testicle was then delivered from the scrotum and the gubernacular attachments were taken down using blunt dissection and the Bovie.  Once the testicle was detached the dissection was carried  proximally to the internal ring once again with great care being taken to avoid injury to the ileal inguinal nerve.  The cord was divided into 2 packets and divided.  The packets were then doubly ligated with 0 Vicryl ties.  A cord block was performed 2 mL of of quarter percent Marcaine.  The wound was inspected for hemostasis and no active bleeding was identified.  The external oblique fascia was closed using a running 3-0 Vicryl once again with great care being taken to avoid injury to the ilioinguinal nerve.  The scrotum was pulled down to ensure it was not tethered.  The subcutaneous tissues were infiltrated with remaining 8 mL of quarter percent Marcaine and then reapproximated using interrupted 3-0 Vicryl sutures.  The skin was closed using a running intracuticular 4-0 Monocryl and the wound was then reinforced after cleaning with Dermabond.  A dressing was applied after the Dermabond dried.  The anesthetic was reversed and he was moved recovery in stable condition.  There were no complications.  The testicle was sent to pathology for analysis.

## 2019-10-19 NOTE — Transfer of Care (Signed)
Immediate Anesthesia Transfer of Care Note  Patient: Cory Powell  Procedure(s) Performed: Procedure(s) with comments: RADICAL ORCHIECTOMY (Left) - 1 HR  Patient Location: PACU  Anesthesia Type:General  Level of Consciousness: Alert, Awake, Oriented  Airway & Oxygen Therapy: Patient Spontanous Breathing  Post-op Assessment: Report given to RN  Post vital signs: Reviewed and stable  Last Vitals:  Vitals:   10/19/19 1035  BP: 126/83  Pulse: 77  Resp: 18  Temp: 37.3 C  SpO2: 32%    Complications: No apparent anesthesia complications

## 2019-10-19 NOTE — Interval H&P Note (Signed)
History and Physical Interval Note:  His recent labs were normal except for a minimal increase in the AFP to 6.9 and an elevation of the LDH to 331.   10/19/2019 10:29 AM  Cory Powell  has presented today for surgery, with the diagnosis of LEFT TESTICULAR MASS.  The various methods of treatment have been discussed with the patient and family. After consideration of risks, benefits and other options for treatment, the patient has consented to  Procedure(s) with comments: RADICAL ORCHIECTOMY (Left) - 1 HR as a surgical intervention.  The patient's history has been reviewed, patient examined, no change in status, stable for surgery.  I have reviewed the patient's chart and labs.  Questions were answered to the patient's satisfaction.     [name]

## 2019-10-20 ENCOUNTER — Encounter (HOSPITAL_COMMUNITY): Payer: Self-pay | Admitting: Urology

## 2019-10-22 LAB — SURGICAL PATHOLOGY

## 2019-10-25 ENCOUNTER — Telehealth: Payer: Self-pay | Admitting: Oncology

## 2019-10-25 NOTE — Telephone Encounter (Signed)
Scheduled appointment per 10/18 new patient referral. Spoke to patient who is aware of appointment date and time.

## 2019-10-27 ENCOUNTER — Telehealth: Payer: Self-pay | Admitting: Oncology

## 2019-10-27 NOTE — Telephone Encounter (Signed)
Mr. Debruyne has been cld and rescheduled to see Dr. Alen Blew on 10/25 at 10am.

## 2019-11-01 ENCOUNTER — Inpatient Hospital Stay: Payer: 59 | Attending: Oncology | Admitting: Oncology

## 2019-11-01 ENCOUNTER — Other Ambulatory Visit: Payer: Self-pay

## 2019-11-01 DIAGNOSIS — Z800 Family history of malignant neoplasm of digestive organs: Secondary | ICD-10-CM

## 2019-11-01 DIAGNOSIS — Z9079 Acquired absence of other genital organ(s): Secondary | ICD-10-CM

## 2019-11-01 DIAGNOSIS — C6292 Malignant neoplasm of left testis, unspecified whether descended or undescended: Secondary | ICD-10-CM

## 2019-11-01 DIAGNOSIS — C6290 Malignant neoplasm of unspecified testis, unspecified whether descended or undescended: Secondary | ICD-10-CM | POA: Insufficient documentation

## 2019-11-01 MED ORDER — PROCHLORPERAZINE MALEATE 10 MG PO TABS: 10.0000 mg | ORAL_TABLET | Freq: Four times a day (QID) | ORAL | 0 refills | Status: AC | PRN

## 2019-11-01 MED ORDER — LIDOCAINE-PRILOCAINE 2.5-2.5 % EX CREA: 1.0000 "application " | TOPICAL_CREAM | CUTANEOUS | 0 refills | Status: AC | PRN

## 2019-11-01 NOTE — Progress Notes (Signed)
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

## 2019-11-01 NOTE — Progress Notes (Signed)
Reason for the request:    Testicular cancer  HPI: I was asked by Dr. Jeffie Pollock to evaluate Cory Powell for the diagnosis of testicular cancer.  He is a 29 year old with no significant comorbid conditions found to have a left testicular mass on self-examination in September 2021.  He was evaluated by Dr. [name] and subsequently referred to Dr. Jeffie Pollock.  Scrotal ultrasound at that time demonstrated a left testicular mass that is worrisome for neoplasm.  He underwent a left radical orchiectomy completed on October 19, 2019.  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  Indicating T2 disease.  Staging work-up including a CT scan chest abdomen and pelvis showed a 5.0 x 3.3 x 3.3 cm left-sided retroperitoneal nodal mass consistent with metastatic disease.  On October 11 his LDH was 331, beta-hCG less than 1 and alpha-fetoprotein mildly elevated at 6.9.  Clinically, he reports no complaints at this time.  He has fully recovered from surgery and resumed all activities of daily living.  He denied any pelvic pain or discomfort.  Denied any discharge.  He denied any childhood diseases or any undescended testes for his mother.  No family history of malignancy.  He does not report any headaches, blurry vision, syncope or seizures. Does not report any fevers, chills or sweats.  Does not report any cough, wheezing or hemoptysis.  Does not report any chest pain, palpitation, orthopnea or leg edema.  Does not report any nausea, vomiting or abdominal pain.  Does not report any constipation or diarrhea.  Does not report any skeletal complaints.    Does not report frequency, urgency or hematuria.  Does not report any skin rashes or lesions. Does not report any heat or cold intolerance.  Does not report any lymphadenopathy or petechiae.  Does not report any anxiety or depression.  Remaining review of systems is negative.    No  past medical history on file.:  Past Surgical History:  Procedure Laterality Date  . INNER EAR SURGERY    . ORCHIECTOMY Left 10/19/2019   Procedure: RADICAL ORCHIECTOMY;  Surgeon: [name], MD;  Location: [facility];  Service: Urology;  Laterality: Left;  1 HR  . WISDOM TOOTH EXTRACTION    :   Current Outpatient Medications:  .  HYDROcodone-acetaminophen (NORCO/VICODIN) 5-325 MG tablet, Take 1 tablet by mouth every 6 (six) hours as needed for moderate pain., Disp: 8 tablet, Rfl: 0:  No Known Allergies:  Family History  Problem Relation Age of Onset  . Colon cancer Father 90  :  Social History   Socioeconomic History  . Marital status: Married    Spouse name: Not on file  . Number of children: Not on file  . Years of education: Not on file  . Highest education level: Not on file  Occupational History  . Occupation: Herbalist: [employer]  Tobacco Use  . Smoking status: Never Smoker  . Smokeless tobacco: Never Used  Vaping Use  . Vaping Use: Never used  Substance and Sexual Activity  . Alcohol use: Yes    Alcohol/week: 23.0 standard drinks    Types: 23 Cans of beer per week    Comment: 23- drinks per week  . Drug use: Yes    Types: Marijuana    Comment: 2x per month  . Sexual activity: Yes  Other Topics Concern  . Not on file  Social History Narrative  .  Not on file   Social Determinants of Health   Financial Resource Strain:   . Difficulty of Paying Living Expenses: Not on file  Food Insecurity:   . Worried About Charity fundraiser in the Last Year: Not on file  . Ran Out of Food in the Last Year: Not on file  Transportation Needs:   . Lack of Transportation (Medical): Not on file  . Lack of Transportation (Non-Medical): Not on file  Physical Activity:   . Days of Exercise per Week: Not on file  . Minutes of Exercise per Session: Not on file  Stress:   . Feeling of Stress : Not on file  Social Connections:   . Frequency of Communication  with Friends and Family: Not on file  . Frequency of Social Gatherings with Friends and Family: Not on file  . Attends Religious Services: Not on file  . Active Member of Clubs or Organizations: Not on file  . Attends Archivist Meetings: Not on file  . Marital Status: Not on file  Intimate Partner Violence:   . Fear of Current or Ex-Partner: Not on file  . Emotionally Abused: Not on file  . Physically Abused: Not on file  . Sexually Abused: Not on file  :  Pertinent items are noted in HPI.  Exam: Blood pressure 124/77, pulse 87, temperature 98.1 F (36.7 C), temperature source Tympanic, resp. rate 18, weight 165 lb 14.4 oz (75.3 kg), SpO2 99 %.  ECOG 0  General appearance: alert and cooperative appeared without distress. Head: atraumatic without any abnormalities. Eyes: conjunctivae/corneas clear. PERRL.  Sclera anicteric. Throat: lips, mucosa, and tongue normal; without oral thrush or ulcers. Resp: clear to auscultation bilaterally without rhonchi, wheezes or dullness to percussion. Cardio: regular rate and rhythm, S1, S2 normal, no murmur, click, rub or gallop GI: soft, non-tender; bowel sounds normal; no masses,  no organomegaly Skin: Skin color, texture, turgor normal. No rashes or lesions Lymph nodes: Cervical, supraclavicular, and axillary nodes normal. Neurologic: Grossly normal without any motor, sensory or deep tendon reflexes. Musculoskeletal: No joint deformity or effusion.    Assessment and Plan:   29 year old man with:  1.  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  His tumor markers are close to normal at baseline.  Staging work-up showed 3.3 x 5.0 left-sided retroperitoneal nodal mass consistent with metastatic disease.   The natural course of this disease was reviewed and treatment options were discussed.  Systemic chemotherapy remains the standard  of care at this time given his stage IIb status with retroperitoneal lymph node dissection would be used as a salvage option post chemotherapy if she has residual disease.  The logistics of chemotherapy administration were reviewed at this time.  Utilizing 3 cycles of BEP as the main treatment option and standard of care.  Complications with chemotherapy including nausea, vomiting, myelosuppression, neutropenia and possible sepsis.  Complication associated with bleomycin including pulmonary toxicity and fevers were reviewed.  Issues of fertility and secondary malignancy were discussed today in detail.  Overall the risks of those specific toxicities are low.  After discussion today he is agreeable to proceed with the BEP chemotherapy will be given for a total of 3 cycles every 21 days.  Chemotherapy education class will be arranged for prior to starting.  2.  IV access risks and benefits of Port-A-Cath insertion were discussed.  Complications that include  bleeding, thrombosis and infection were reiterated.  He is agreeable to proceed.  3.  Antiemetics: Prescription for Compazine will be available to him.  4.  Fertility issues: He has contemplated and researched sperm banking at this time he is currently not considering it.  He would like to start chemotherapy as soon as possible.  5.  Renal surveillance: We will monitor his kidney function on cisplatin based therapy.  Baseline kidney function is normal.  6.  Pulmonary toxicity surveillance: He will have pulmonary function test prior to each cycle of therapy.  7.  Follow-up:  will be in the immediate future to start for cycle of BEP chemotherapy.  80  minutes were dedicated to this visit. The time was spent on reviewing laboratory data, imaging studies, discussing treatment options, discussing short and long-term complications to chemotherapy and answering questions regarding future plan.   A copy of this consult has been forwarded to the  requesting physician.

## 2019-11-04 ENCOUNTER — Telehealth: Payer: Self-pay | Admitting: Oncology

## 2019-11-04 NOTE — Telephone Encounter (Signed)
Called patient to confirm Chemo Education class. Patient is aware of appointment date and time.

## 2019-11-05 ENCOUNTER — Other Ambulatory Visit: Payer: Self-pay

## 2019-11-05 ENCOUNTER — Inpatient Hospital Stay: Payer: 59

## 2019-11-05 ENCOUNTER — Other Ambulatory Visit: Payer: Self-pay | Admitting: Radiology

## 2019-11-08 ENCOUNTER — Other Ambulatory Visit: Payer: Self-pay

## 2019-11-08 ENCOUNTER — Ambulatory Visit (HOSPITAL_COMMUNITY)
Admission: RE | Admit: 2019-11-08 | Discharge: 2019-11-08 | Disposition: A | Discharge status: Home / Self Care | Payer: 59 | Source: Ambulatory Visit | Attending: Oncology | Admitting: Oncology

## 2019-11-08 ENCOUNTER — Other Ambulatory Visit: Payer: Self-pay | Admitting: Oncology

## 2019-11-08 ENCOUNTER — Encounter (HOSPITAL_COMMUNITY): Payer: Self-pay

## 2019-11-08 DIAGNOSIS — C6292 Malignant neoplasm of left testis, unspecified whether descended or undescended: Secondary | ICD-10-CM | POA: Diagnosis not present

## 2019-11-08 HISTORY — PX: IR IMAGING GUIDED PORT INSERTION: IMG5740

## 2019-11-08 MED ORDER — FENTANYL CITRATE (PF) 100 MCG/2ML IJ SOLN
INTRAMUSCULAR | Status: AC | PRN
  Administered 2019-11-08 (×4): 50 ug via INTRAVENOUS

## 2019-11-08 MED ORDER — MIDAZOLAM HCL 2 MG/2ML IJ SOLN
INTRAMUSCULAR | Status: AC | PRN
  Administered 2019-11-08 (×2): 1 mg via INTRAVENOUS
  Administered 2019-11-08 (×2): 2 mg via INTRAVENOUS

## 2019-11-08 MED ORDER — CEFAZOLIN SODIUM-DEXTROSE 2-4 GM/100ML-% IV SOLN
INTRAVENOUS | Status: AC
  Administered 2019-11-08: 2 g via INTRAVENOUS
  Filled 2019-11-08: qty 100

## 2019-11-08 MED ORDER — LIDOCAINE HCL 1 % IJ SOLN
INTRAMUSCULAR | Status: AC
  Filled 2019-11-08: qty 20

## 2019-11-08 MED ORDER — SODIUM CHLORIDE 0.9 % IV SOLN: INTRAVENOUS | Status: DC

## 2019-11-08 MED ORDER — CEFAZOLIN SODIUM-DEXTROSE 2-4 GM/100ML-% IV SOLN: 2.0000 g | Freq: Once | INTRAVENOUS | Status: AC

## 2019-11-08 MED ORDER — HEPARIN SOD (PORK) LOCK FLUSH 100 UNIT/ML IV SOLN
INTRAVENOUS | Status: AC | PRN
  Administered 2019-11-08: 500 [IU] via INTRAVENOUS

## 2019-11-08 MED ORDER — LIDOCAINE HCL (PF) 1 % IJ SOLN
INTRAMUSCULAR | Status: AC | PRN
  Administered 2019-11-08 (×2): 10 mL via INTRADERMAL

## 2019-11-08 MED ORDER — MIDAZOLAM HCL 2 MG/2ML IJ SOLN
INTRAMUSCULAR | Status: AC
  Filled 2019-11-08: qty 2

## 2019-11-08 MED ORDER — FENTANYL CITRATE (PF) 100 MCG/2ML IJ SOLN
INTRAMUSCULAR | Status: AC
  Filled 2019-11-08: qty 4

## 2019-11-08 MED ORDER — MIDAZOLAM HCL 2 MG/2ML IJ SOLN
INTRAMUSCULAR | Status: AC
  Filled 2019-11-08: qty 4

## 2019-11-08 MED ORDER — HEPARIN SOD (PORK) LOCK FLUSH 100 UNIT/ML IV SOLN
INTRAVENOUS | Status: AC
  Filled 2019-11-08: qty 5

## 2019-11-08 NOTE — Progress Notes (Signed)
Pt sitting in stretcher post port insertion with Ice to site. Pain 5-6/10. Slightly edematous, request Dr Kathlene Cote assess site prior to discharge.

## 2019-11-08 NOTE — Consult Note (Signed)
Chief Complaint: Patient was seen in consultation today for port a cath placement  Referring Physician(s): Wyatt Portela  Supervising Physician: Aletta Edouard  Patient Status: [place]  History of Present Illness: Cory Powell is a 29 y.o. male with history of recently diagnosed testicular cancer, status post left radical orchiectomy on 10/19/2019.  He presents today for Port-A-Cath placement for planned chemotherapy.  History reviewed. No pertinent past medical history.  Past Surgical History:  Procedure Laterality Date  . INNER EAR SURGERY    . ORCHIECTOMY Left 10/19/2019   Procedure: RADICAL ORCHIECTOMY;  Surgeon: [name], MD;  Location: [facility];  Service: Urology;  Laterality: Left;  1 HR  . WISDOM TOOTH EXTRACTION      Allergies: Patient has no known allergies.  Medications: Prior to Admission medications   Medication Sig Start Date End Date Taking? Authorizing Provider  HYDROcodone-acetaminophen (NORCO/VICODIN) 5-325 MG tablet Take 1 tablet by mouth every 6 (six) hours as needed for moderate pain. 10/19/19 10/18/20  [name], MD  lidocaine-prilocaine (EMLA) cream Apply 1 application topically as needed. 11/01/19   Wyatt Portela, MD  prochlorperazine (COMPAZINE) 10 MG tablet Take 1 tablet (10 mg total) by mouth every 6 (six) hours as needed for nausea or vomiting. 11/01/19   Wyatt Portela, MD     Family History  Problem Relation Age of Onset  . Colon cancer Father 28    Social History   Socioeconomic History  . Marital status: Married    Spouse name: Not on file  . Number of children: Not on file  . Years of education: Not on file  . Highest education level: Not on file  Occupational History  . Occupation: Herbalist: [employer]  Tobacco Use  . Smoking status: Never Smoker  . Smokeless tobacco: Never Used  Vaping Use  . Vaping Use: Never used  Substance and Sexual Activity  . Alcohol use: Yes    Alcohol/week: 23.0  standard drinks    Types: 23 Cans of beer per week    Comment: 23- drinks per week  . Drug use: Yes    Types: Marijuana    Comment: 2x per month  . Sexual activity: Yes  Other Topics Concern  . Not on file  Social History Narrative  . Not on file   Social Determinants of Health   Financial Resource Strain:   . Difficulty of Paying Living Expenses: Not on file  Food Insecurity:   . Worried About Charity fundraiser in the Last Year: Not on file  . Ran Out of Food in the Last Year: Not on file  Transportation Needs:   . Lack of Transportation (Medical): Not on file  . Lack of Transportation (Non-Medical): Not on file  Physical Activity:   . Days of Exercise per Week: Not on file  . Minutes of Exercise per Session: Not on file  Stress:   . Feeling of Stress : Not on file  Social Connections:   . Frequency of Communication with Friends and Family: Not on file  . Frequency of Social Gatherings with Friends and Family: Not on file  . Attends Religious Services: Not on file  . Active Member of Clubs or Organizations: Not on file  . Attends Archivist Meetings: Not on file  . Marital Status: Not on file      Review of Systems currently denies fever, headache, chest pain, dyspnea, cough, abdominal/back pain, nausea, vomiting or  bleeding.  Vital Signs: BP 127/81   Pulse 63   Temp 98 F (36.7 C) (Oral)   Resp 17   SpO2 99%   Physical Exam awake, alert.  Chest clear to auscultation bilaterally.  Heart with regular rate and rhythm.  Abdomen soft, positive bowel sounds, nontender.  No lower extremity edema.  Imaging: No results found.  Labs:  CBC: Recent Labs    10/15/19 0949  WBC 6.4  HGB 14.1  HCT 41.6  PLT 226    COAGS: No results for input(s): INR, APTT in the last 8760 hours.  BMP: No results for input(s): NA, K, CL, CO2, GLUCOSE, BUN, CALCIUM, CREATININE, GFRNONAA, GFRAA in the last 8760 hours.  Invalid input(s): CMP  LIVER FUNCTION  TESTS: No results for input(s): BILITOT, AST, ALT, ALKPHOS, PROT, ALBUMIN in the last 8760 hours.  TUMOR MARKERS: No results for input(s): AFPTM, CEA, CA199, CHROMGRNA in the last 8760 hours.  Assessment and Plan: 29 y.o. male with history of recently diagnosed testicular cancer, status post left radical orchiectomy on 10/19/2019.  He presents today for Port-A-Cath placement for planned chemotherapy.Risks and benefits of image guided port-a-catheter placement was discussed with the patient including, but not limited to bleeding, infection, pneumothorax, or fibrin sheath development and need for additional procedures.  All of the patient's questions were answered, patient is agreeable to proceed. Consent signed and in chart.     Thank you for this interesting consult.  I greatly enjoyed meeting Cory Powell and look forward to participating in their care.  A copy of this report was sent to the requesting provider on this date.  Electronically Signed: D. Rowe Robert, PA-C 11/08/2019, 1:29 PM   I spent a total of  25 minutes   in face to face in clinical consultation, greater than 50% of which was counseling/coordinating care for Port-A-Cath placement

## 2019-11-08 NOTE — Discharge Instructions (Signed)
Please call Interventional Radiology clinic [phone] with any questions or concerns.  You may remove your dressing and shower tomorrow. Do not submerge in tub or pool until site is healed.  Glue should flake off on its own.  DO NOT use EMLA cream for 2 weeks after port placement as this cream will remove surgical glue on your incision.  Your port should be flushed on a monthly basis.   Implanted Port Insertion, Care After This sheet gives you information about how to care for yourself after your procedure. Your health care provider may also give you more specific instructions. If you have problems or questions, contact your health care provider. What can I expect after the procedure? After the procedure, it is common to have:  Discomfort at the port insertion site.  Bruising on the skin over the port. This should improve over 3-4 days. Follow these instructions at home: [facility] care  After your port is placed, you will get a manufacturer's information card. The card has information about your port. Keep this card with you at all times.  Take care of the port as told by your health care provider. Ask your health care provider if you or a family member can get training for taking care of the port at home. A home health care nurse may also take care of the port.  Make sure to remember what type of port you have. Incision care   Follow instructions from your health care provider about how to take care of your port insertion site. Make sure you: ? Wash your hands with soap and water before and after you change your bandage (dressing). If soap and water are not available, use hand sanitizer. ? Change your dressing as told by your health care provider. ? Leave stitches (sutures), skin glue, or adhesive strips in place. These skin closures may need to stay in place for 2 weeks or longer. If adhesive strip edges start to loosen and curl up, you may trim the loose edges. Do not remove adhesive  strips completely unless your health care provider tells you to do that.  Check your port insertion site every day for signs of infection. Check for: ? Redness, swelling, or pain. ? Fluid or blood. ? Warmth. ? Pus or a bad smell. Activity  Return to your normal activities as told by your health care provider. Ask your health care provider what activities are safe for you.  Do not lift anything that is heavier than 10 lb (4.5 kg), or the limit that you are told, until your health care provider says that it is safe. General instructions  Take over-the-counter and prescription medicines only as told by your health care provider.  Do not take baths, swim, or use a hot tub until your health care provider approves. Ask your health care provider if you may take showers. You may only be allowed to take sponge baths.  Do not drive for 24 hours if you were given a sedative during your procedure.  Wear a medical alert bracelet in case of an emergency. This will tell any health care providers that you have a port.  Keep all follow-up visits as told by your health care provider. This is important. Contact a health care provider if:  You cannot flush your port with saline as directed, or you cannot draw blood from the port.  You have a fever or chills.  You have redness, swelling, or pain around your port insertion site.  You have  fluid or blood coming from your port insertion site.  Your port insertion site feels warm to the touch.  You have pus or a bad smell coming from the port insertion site. Get help right away if:  You have chest pain or shortness of breath.  You have bleeding from your port that you cannot control. Summary  Take care of the port as told by your health care provider. Keep the manufacturer's information card with you at all times.  Change your dressing as told by your health care provider.  Contact a health care provider if you have a fever or chills or if you  have redness, swelling, or pain around your port insertion site.  Keep all follow-up visits as told by your health care provider. This information is not intended to replace advice given to you by your health care provider. Make sure you discuss any questions you have with your health care provider. Document Revised: 07/22/2017 Document Reviewed: 07/22/2017 Elsevier Patient Education  [place].   Moderate Conscious Sedation, Adult, Care After These instructions provide you with information about caring for yourself after your procedure. Your health care provider may also give you more specific instructions. Your treatment has been planned according to current medical practices, but problems sometimes occur. Call your health care provider if you have any problems or questions after your procedure. What can I expect after the procedure? After your procedure, it is common:  To feel sleepy for several hours.  To feel clumsy and have poor balance for several hours.  To have poor judgment for several hours.  To vomit if you eat too soon. Follow these instructions at home: For at least 24 hours after the procedure:Do not: ? Participate in activities where you could fall or become injured. ? Drive. ? Use heavy machinery. ? Drink alcohol. ? Take sleeping pills or medicines that cause drowsiness. ? Make important decisions or sign legal documents. ? Take care of children on your own.  Rest. Eating and drinking  Follow the diet recommended by your health care provider.  If you vomit: ? Drink water, juice, or soup when you can drink without vomiting. ? Make sure you have little or no nausea before eating solid foods. General instructions  Have a responsible adult stay with you until you are awake and alert.  Take over-the-counter and prescription medicines only as told by your health care provider.  If you smoke, do not smoke without supervision.  Keep all follow-up visits  as told by your health care provider. This is important. Contact a health care provider if:  You keep feeling nauseous or you keep vomiting.  You feel light-headed.  You develop a rash.  You have a fever. Get help right away if:  You have trouble breathing. This information is not intended to replace advice given to you by your health care provider. Make sure you discuss any questions you have with your health care provider. Document Revised: 12/06/2016 Document Reviewed: 04/15/2015 Elsevier Patient Education  2020 [employer].

## 2019-11-08 NOTE — Procedures (Signed)
Interventional Radiology Procedure Note  Procedure: Single Lumen Power Port Placement    Access:  Right IJ vein.  Findings: Catheter tip positioned at SVC/RA junction. Port is ready for immediate use.   Complications: None  EBL: < 10 mL  Recommendations:  - Ok to shower in 24 hours - Do not submerge for 7 days - Routine line care   Onalee Steinbach T. Halford Goetzke, M.D Pager:  [phone]   

## 2019-11-09 ENCOUNTER — Ambulatory Visit: Payer: 59 | Admitting: Oncology

## 2019-11-09 NOTE — Progress Notes (Signed)
29 y.o. male outpatient. 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   Spoke with patient via phone today to discuss how the patient is feeling post procedure and answer any questions or concerns that he may have.  Patient reports improving better pain that is better with tylenol and mild swelling.nt.  He denies any questions or concerns at this time. Post procedure recommendations were reviewed and additional education provided. Patient instructed to call IR should  any questions or concerns arise. Patient is in agreement with plan of care.

## 2019-11-10 ENCOUNTER — Other Ambulatory Visit (HOSPITAL_COMMUNITY): Payer: 59

## 2019-11-10 ENCOUNTER — Other Ambulatory Visit (HOSPITAL_COMMUNITY)
Admission: RE | Admit: 2019-11-10 | Discharge: 2019-11-10 | Disposition: A | Discharge status: Home / Self Care | Payer: 59 | Source: Ambulatory Visit | Attending: Oncology | Admitting: Oncology

## 2019-11-10 DIAGNOSIS — Z01812 Encounter for preprocedural laboratory examination: Secondary | ICD-10-CM | POA: Diagnosis present

## 2019-11-10 DIAGNOSIS — Z20822 Contact with and (suspected) exposure to covid-19: Secondary | ICD-10-CM | POA: Diagnosis not present

## 2019-11-10 LAB — SARS CORONAVIRUS 2 (TAT 6-24 HRS): SARS Coronavirus 2: NEGATIVE

## 2019-11-12 ENCOUNTER — Other Ambulatory Visit: Payer: Self-pay

## 2019-11-12 ENCOUNTER — Ambulatory Visit (HOSPITAL_COMMUNITY)
Admission: RE | Admit: 2019-11-12 | Discharge: 2019-11-12 | Disposition: A | Discharge status: Home / Self Care | Payer: 59 | Source: Ambulatory Visit | Attending: Oncology | Admitting: Oncology

## 2019-11-12 DIAGNOSIS — C6292 Malignant neoplasm of left testis, unspecified whether descended or undescended: Secondary | ICD-10-CM | POA: Insufficient documentation

## 2019-11-12 LAB — PULMONARY FUNCTION TEST
DL/VA % pred: 97 %
DL/VA: 4.88 ml/min/mmHg/L
DLCO unc % pred: 112 %
DLCO unc: 32.51 ml/min/mmHg
FEF 25-75 Pre: 4.47 L/sec
FEF2575-%Pred-Pre: 108 %
FEV1-%Pred-Pre: 112 %
FEV1-Pre: 4.45 L
FEV1FVC-%Pred-Pre: 100 %
FEV6-%Pred-Pre: 113 %
FEV6-Pre: 5.43 L
FEV6FVC-%Pred-Pre: 101 %
FVC-%Pred-Pre: 112 %
FVC-Pre: 5.43 L
Pre FEV1/FVC ratio: 82 %
Pre FEV6/FVC Ratio: 100 %
RV % pred: 84 %
RV: 1.18 L
TLC % pred: 107 %
TLC: 6.52 L

## 2019-11-15 ENCOUNTER — Inpatient Hospital Stay: Payer: 59

## 2019-11-15 ENCOUNTER — Inpatient Hospital Stay: Payer: 59 | Attending: Oncology

## 2019-11-15 ENCOUNTER — Other Ambulatory Visit: Payer: Self-pay

## 2019-11-15 VITALS — BP 126/76 | HR 71 | Temp 98.7°F | Resp 16 | Ht 66.0 in | Wt 165.9 lb

## 2019-11-15 DIAGNOSIS — Z5111 Encounter for antineoplastic chemotherapy: Secondary | ICD-10-CM | POA: Diagnosis not present

## 2019-11-15 DIAGNOSIS — C6292 Malignant neoplasm of left testis, unspecified whether descended or undescended: Secondary | ICD-10-CM | POA: Insufficient documentation

## 2019-11-15 DIAGNOSIS — Z23 Encounter for immunization: Secondary | ICD-10-CM | POA: Diagnosis not present

## 2019-11-15 DIAGNOSIS — Z95828 Presence of other vascular implants and grafts: Secondary | ICD-10-CM | POA: Insufficient documentation

## 2019-11-15 LAB — CMP (CANCER CENTER ONLY)
ALT: 22 U/L (ref 0–44)
AST: 23 U/L (ref 15–41)
Albumin: 4.5 g/dL (ref 3.5–5.0)
Alkaline Phosphatase: 44 U/L (ref 38–126)
Anion gap: 7 (ref 5–15)
BUN: 20 mg/dL (ref 6–20)
CO2: 26 mmol/L (ref 22–32)
Calcium: 9.9 mg/dL (ref 8.9–10.3)
Chloride: 104 mmol/L (ref 98–111)
Creatinine: 1.05 mg/dL (ref 0.61–1.24)
GFR, Estimated: >60 mL/min (ref >60)
Glucose, Bld: 87 mg/dL (ref 70–99)
Potassium: 3.9 mmol/L (ref 3.5–5.1)
Sodium: 137 mmol/L (ref 135–145)
Total Bilirubin: 0.7 mg/dL (ref 0.3–1.2)
Total Protein: 7.9 g/dL (ref 6.5–8.1)

## 2019-11-15 LAB — CBC WITH DIFFERENTIAL (CANCER CENTER ONLY)
Abs Immature Granulocytes: 0.02 10*3/uL (ref 0.00–0.07)
Basophils Absolute: 0 10*3/uL (ref 0.0–0.1)
Basophils Relative: 0 %
Eosinophils Absolute: 0.1 10*3/uL (ref 0.0–0.5)
Eosinophils Relative: 1 %
HCT: 39.1 % (ref 39.0–52.0)
Hemoglobin: 14.1 g/dL (ref 13.0–17.0)
Immature Granulocytes: 0 %
Lymphocytes Relative: 23 %
Lymphs Abs: 1.7 10*3/uL (ref 0.7–4.0)
MCH: 30.3 pg (ref 26.0–34.0)
MCHC: 36.1 g/dL — ABNORMAL HIGH (ref 30.0–36.0)
MCV: 83.9 fL (ref 80.0–100.0)
Monocytes Absolute: 0.5 10*3/uL (ref 0.1–1.0)
Monocytes Relative: 7 %
Neutro Abs: 4.9 10*3/uL (ref 1.7–7.7)
Neutrophils Relative %: 69 %
Platelet Count: 266 10*3/uL (ref 150–400)
RBC: 4.66 MIL/uL (ref 4.22–5.81)
RDW: 11.2 % — ABNORMAL LOW (ref 11.5–15.5)
WBC Count: 7.2 10*3/uL (ref 4.0–10.5)
nRBC: 0 % (ref 0.0–0.2)

## 2019-11-15 LAB — LACTATE DEHYDROGENASE: LDH: 412 U/L — ABNORMAL HIGH (ref 98–192)

## 2019-11-15 MED ORDER — SODIUM CHLORIDE 0.9 % IV SOLN
100.0000 mg/m2 | Freq: Once | INTRAVENOUS | Status: AC
  Administered 2019-11-15: 190 mg via INTRAVENOUS
  Filled 2019-11-15: qty 9.5

## 2019-11-15 MED ORDER — SODIUM CHLORIDE 0.9 % IV SOLN
Freq: Once | INTRAVENOUS | Status: AC
  Filled 2019-11-15: qty 250

## 2019-11-15 MED ORDER — SODIUM CHLORIDE 0.9 % IV SOLN
10.0000 mg | Freq: Once | INTRAVENOUS | Status: AC
  Administered 2019-11-15: 10 mg via INTRAVENOUS
  Filled 2019-11-15: qty 10

## 2019-11-15 MED ORDER — SODIUM CHLORIDE 0.9 % IV SOLN
16.0000 mg | Freq: Once | INTRAVENOUS | Status: AC
  Administered 2019-11-15: 16 mg via INTRAVENOUS
  Filled 2019-11-15: qty 8

## 2019-11-15 MED ORDER — SODIUM CHLORIDE 0.9 % IV SOLN
150.0000 mg | Freq: Once | INTRAVENOUS | Status: AC
  Administered 2019-11-15: 150 mg via INTRAVENOUS
  Filled 2019-11-15: qty 150

## 2019-11-15 MED ORDER — HEPARIN SOD (PORK) LOCK FLUSH 100 UNIT/ML IV SOLN
500.0000 [IU] | Freq: Once | INTRAVENOUS | Status: AC | PRN
  Administered 2019-11-15: 500 [IU]
  Filled 2019-11-15: qty 5

## 2019-11-15 MED ORDER — SODIUM CHLORIDE 0.9 % IV SOLN
Freq: Once | INTRAVENOUS | Status: AC
  Filled 2019-11-15: qty 10

## 2019-11-15 MED ORDER — SODIUM CHLORIDE 0.9% FLUSH
10.0000 mL | Freq: Once | INTRAVENOUS | Status: AC
  Administered 2019-11-15: 10 mL
  Filled 2019-11-15: qty 10

## 2019-11-15 MED ORDER — SODIUM CHLORIDE 0.9% FLUSH
10.0000 mL | INTRAVENOUS | Status: DC | PRN
  Administered 2019-11-15: 10 mL
  Filled 2019-11-15: qty 10

## 2019-11-15 MED ORDER — SODIUM CHLORIDE 0.9 % IV SOLN
20.0000 mg/m2 | Freq: Once | INTRAVENOUS | Status: AC
  Administered 2019-11-15: 37 mg via INTRAVENOUS
  Filled 2019-11-15: qty 37

## 2019-11-15 NOTE — Patient Instructions (Addendum)
[facility] Discharge Instructions for Patients Receiving Chemotherapy  Today you received the following chemotherapy agent Etoposide and  Cisplatin  To help prevent nausea and vomiting after your treatment, we encourage you to take your nausea medication as directed.  If you develop nausea and vomiting that is not controlled by your nausea medication, call the clinic.   BELOW ARE SYMPTOMS THAT SHOULD BE REPORTED IMMEDIATELY:  *FEVER GREATER THAN 100.5 F  *CHILLS WITH OR WITHOUT FEVER  NAUSEA AND VOMITING THAT IS NOT CONTROLLED WITH YOUR NAUSEA MEDICATION  *UNUSUAL SHORTNESS OF BREATH  *UNUSUAL BRUISING OR BLEEDING  TENDERNESS IN MOUTH AND THROAT WITH OR WITHOUT PRESENCE OF ULCERS  *URINARY PROBLEMS  *BOWEL PROBLEMS  UNUSUAL RASH Items with * indicate a potential emergency and should be followed up as soon as possible.  Feel free to call the clinic should you have any questions or concerns. The clinic phone number is [phone].  Please show the [facility] at check-in to the Emergency Department and triage nurse. Etoposide, VP-16 injection What is this medicine? ETOPOSIDE, VP-16 (e toe POE side) is a chemotherapy drug. It is used to treat testicular cancer, lung cancer, and other cancers. This medicine may be used for other purposes; ask your health care provider or pharmacist if you have questions. COMMON BRAND NAME(S): Etopophos, Toposar, VePesid What should I tell my health care provider before I take this medicine? They need to know if you have any of these conditions:  infection  kidney disease  liver disease  low blood counts, like low white cell, platelet, or red cell counts  an unusual or allergic reaction to etoposide, other medicines, foods, dyes, or preservatives  pregnant or trying to get pregnant  breast-feeding How should I use this medicine? This medicine is for infusion into a vein. It is administered in a hospital or  clinic by a specially trained health care professional. Talk to your pediatrician regarding the use of this medicine in children. Special care may be needed. Overdosage: If you think you have taken too much of this medicine contact a poison control center or emergency room at once. NOTE: This medicine is only for you. Do not share this medicine with others. What if I miss a dose? It is important not to miss your dose. Call your doctor or health care professional if you are unable to keep an appointment. What may interact with this medicine? This medicine may interact with the following medications:  warfarin This list may not describe all possible interactions. Give your health care provider a list of all the medicines, herbs, non-prescription drugs, or dietary supplements you use. Also tell them if you smoke, drink alcohol, or use illegal drugs. Some items may interact with your medicine. What should I watch for while using this medicine? Visit your doctor for checks on your progress. This drug may make you feel generally unwell. This is not uncommon, as chemotherapy can affect healthy cells as well as cancer cells. Report any side effects. Continue your course of treatment even though you feel ill unless your doctor tells you to stop. In some cases, you may be given additional medicines to help with side effects. Follow all directions for their use. Call your doctor or health care professional for advice if you get a fever, chills or sore throat, or other symptoms of a cold or flu. Do not treat yourself. This drug decreases your body's ability to fight infections. Try to avoid being around people  who are sick. This medicine may increase your risk to bruise or bleed. Call your doctor or health care professional if you notice any unusual bleeding. Talk to your doctor about your risk of cancer. You may be more at risk for certain types of cancers if you take this medicine. Do not become pregnant  while taking this medicine or for at least 6 months after stopping it. Women should inform their doctor if they wish to become pregnant or think they might be pregnant. Women of child-bearing potential will need to have a negative pregnancy test before starting this medicine. There is a potential for serious side effects to an unborn child. Talk to your health care professional or pharmacist for more information. Do not breast-feed an infant while taking this medicine. Men must use a latex condom during sexual contact with a woman while taking this medicine and for at least 4 months after stopping it. A latex condom is needed even if you have had a vasectomy. Contact your doctor right away if your partner becomes pregnant. Do not donate sperm while taking this medicine and for at least 4 months after you stop taking this medicine. Men should inform their doctors if they wish to father a child. This medicine may lower sperm counts. What side effects may I notice from receiving this medicine? Side effects that you should report to your doctor or health care professional as soon as possible:  allergic reactions like skin rash, itching or hives, swelling of the face, lips, or tongue  low blood counts - this medicine may decrease the number of white blood cells, red blood cells, and platelets. You may be at increased risk for infections and bleeding  nausea, vomiting  redness, blistering, peeling or loosening of the skin, including inside the mouth  signs and symptoms of infection like fever; chills; cough; sore throat; pain or trouble passing urine  signs and symptoms of low red blood cells or anemia such as unusually weak or tired; feeling faint or lightheaded; falls; breathing problems  unusual bruising or bleeding Side effects that usually do not require medical attention (report to your doctor or health care professional if they continue or are bothersome):  changes in taste  diarrhea  hair  loss  loss of appetite  mouth sores This list may not describe all possible side effects. Call your doctor for medical advice about side effects. You may report side effects to FDA at 1-800-FDA-1088. Where should I keep my medicine? This drug is given in a hospital or clinic and will not be stored at home. NOTE: This sheet is a summary. It may not cover all possible information. If you have questions about this medicine, talk to your doctor, pharmacist, or health care provider.  2020 Elsevier/Gold Standard (2018-02-18 16:57:15)  Cisplatin injection What is this medicine? CISPLATIN (SIS pla tin) is a chemotherapy drug. It targets fast dividing cells, like cancer cells, and causes these cells to die. This medicine is used to treat many types of cancer like bladder, ovarian, and testicular cancers. This medicine may be used for other purposes; ask your health care provider or pharmacist if you have questions. COMMON BRAND NAME(S): Platinol, Platinol -AQ What should I tell my health care provider before I take this medicine? They need to know if you have any of these conditions:  eye disease, vision problems  hearing problems  kidney disease  low blood counts, like white cells, platelets, or red blood cells  tingling of the fingers  or toes, or other nerve disorder  an unusual or allergic reaction to cisplatin, carboplatin, oxaliplatin, other medicines, foods, dyes, or preservatives  pregnant or trying to get pregnant  breast-feeding How should I use this medicine? This drug is given as an infusion into a vein. It is administered in a hospital or clinic by a specially trained health care professional. Talk to your pediatrician regarding the use of this medicine in children. Special care may be needed. Overdosage: If you think you have taken too much of this medicine contact a poison control center or emergency room at once. NOTE: This medicine is only for you. Do not share this  medicine with others. What if I miss a dose? It is important not to miss a dose. Call your doctor or health care professional if you are unable to keep an appointment. What may interact with this medicine? This medicine may interact with the following medications:  foscarnet  certain antibiotics like amikacin, gentamicin, neomycin, polymyxin B, streptomycin, tobramycin, vancomycin This list may not describe all possible interactions. Give your health care provider a list of all the medicines, herbs, non-prescription drugs, or dietary supplements you use. Also tell them if you smoke, drink alcohol, or use illegal drugs. Some items may interact with your medicine. What should I watch for while using this medicine? Your condition will be monitored carefully while you are receiving this medicine. You will need important blood work done while you are taking this medicine. This drug may make you feel generally unwell. This is not uncommon, as chemotherapy can affect healthy cells as well as cancer cells. Report any side effects. Continue your course of treatment even though you feel ill unless your doctor tells you to stop. This medicine may increase your risk of getting an infection. Call your healthcare professional for advice if you get a fever, chills, or sore throat, or other symptoms of a cold or flu. Do not treat yourself. Try to avoid being around people who are sick. Avoid taking medicines that contain aspirin, acetaminophen, ibuprofen, naproxen, or ketoprofen unless instructed by your healthcare professional. These medicines may hide a fever. This medicine may increase your risk to bruise or bleed. Call your doctor or health care professional if you notice any unusual bleeding. Be careful brushing and flossing your teeth or using a toothpick because you may get an infection or bleed more easily. If you have any dental work done, tell your dentist you are receiving this medicine. Do not become  pregnant while taking this medicine or for 14 months after stopping it. Women should inform their healthcare professional if they wish to become pregnant or think they might be pregnant. Men should not father a child while taking this medicine and for 11 months after stopping it. There is potential for serious side effects to an unborn child. Talk to your healthcare professional for more information. Do not breast-feed an infant while taking this medicine. This medicine has caused ovarian failure in some women. This medicine may make it more difficult to get pregnant. Talk to your healthcare professional if you are concerned about your fertility. This medicine has caused decreased sperm counts in some men. This may make it more difficult to father a child. Talk to your healthcare professional if you are concerned about your fertility. Drink fluids as directed while you are taking this medicine. This will help protect your kidneys. Call your doctor or health care professional if you get diarrhea. Do not treat yourself. What side  effects may I notice from receiving this medicine? Side effects that you should report to your doctor or health care professional as soon as possible:  allergic reactions like skin rash, itching or hives, swelling of the face, lips, or tongue  blurred vision  changes in vision  decreased hearing or ringing of the ears  nausea, vomiting  pain, redness, or irritation at site where injected  pain, tingling, numbness in the hands or feet  signs and symptoms of bleeding such as bloody or black, tarry stools; red or dark brown urine; spitting up blood or brown material that looks like coffee grounds; red spots on the skin; unusual bruising or bleeding from the eyes, gums, or nose  signs and symptoms of infection like fever; chills; cough; sore throat; pain or trouble passing urine  signs and symptoms of kidney injury like trouble passing urine or change in the amount of  urine  signs and symptoms of low red blood cells or anemia such as unusually weak or tired; feeling faint or lightheaded; falls; breathing problems Side effects that usually do not require medical attention (report to your doctor or health care professional if they continue or are bothersome):  loss of appetite  mouth sores  muscle cramps This list may not describe all possible side effects. Call your doctor for medical advice about side effects. You may report side effects to FDA at 1-800-FDA-1088. Where should I keep my medicine? This drug is given in a hospital or clinic and will not be stored at home. NOTE: This sheet is a summary. It may not cover all possible information. If you have questions about this medicine, talk to your doctor, pharmacist, or health care provider.  2020 Elsevier/Gold Standard (2017-12-19 15:59:17)

## 2019-11-15 NOTE — Progress Notes (Signed)
Ok to treat today per VO Dr. Rodena Piety without Mag level

## 2019-11-16 ENCOUNTER — Encounter: Payer: Self-pay | Admitting: Oncology

## 2019-11-16 ENCOUNTER — Other Ambulatory Visit: Payer: Self-pay

## 2019-11-16 ENCOUNTER — Other Ambulatory Visit: Payer: 59

## 2019-11-16 ENCOUNTER — Inpatient Hospital Stay: Payer: 59

## 2019-11-16 VITALS — BP 137/85 | HR 79 | Temp 98.3°F | Resp 18

## 2019-11-16 DIAGNOSIS — C6292 Malignant neoplasm of left testis, unspecified whether descended or undescended: Secondary | ICD-10-CM

## 2019-11-16 DIAGNOSIS — Z5111 Encounter for antineoplastic chemotherapy: Secondary | ICD-10-CM | POA: Diagnosis not present

## 2019-11-16 LAB — BETA HCG QUANT (REF LAB): hCG Quant: 1 m[IU]/mL (ref 0–3)

## 2019-11-16 LAB — AFP TUMOR MARKER: AFP, Serum, Tumor Marker: 4.5 ng/mL (ref 0.0–8.3)

## 2019-11-16 MED ORDER — SODIUM CHLORIDE 0.9% FLUSH
10.0000 mL | INTRAVENOUS | Status: DC | PRN
  Administered 2019-11-16: 10 mL
  Filled 2019-11-16: qty 10

## 2019-11-16 MED ORDER — SODIUM CHLORIDE 0.9 % IV SOLN
100.0000 mg/m2 | Freq: Once | INTRAVENOUS | Status: AC
  Administered 2019-11-16: 190 mg via INTRAVENOUS
  Filled 2019-11-16: qty 9.5

## 2019-11-16 MED ORDER — SODIUM CHLORIDE 0.9 % IV SOLN
Freq: Once | INTRAVENOUS | Status: AC
  Filled 2019-11-16: qty 10

## 2019-11-16 MED ORDER — SODIUM CHLORIDE 0.9 % IV SOLN
20.0000 mg/m2 | Freq: Once | INTRAVENOUS | Status: AC
  Administered 2019-11-16: 37 mg via INTRAVENOUS
  Filled 2019-11-16: qty 37

## 2019-11-16 MED ORDER — SODIUM CHLORIDE 0.9 % IV SOLN
30.0000 [IU] | Freq: Once | INTRAVENOUS | Status: AC
  Administered 2019-11-16: 30 [IU] via INTRAVENOUS
  Filled 2019-11-16: qty 10

## 2019-11-16 MED ORDER — SODIUM CHLORIDE 0.9 % IV SOLN
10.0000 mg | Freq: Once | INTRAVENOUS | Status: AC
  Administered 2019-11-16: 10 mg via INTRAVENOUS
  Filled 2019-11-16: qty 10

## 2019-11-16 MED ORDER — SODIUM CHLORIDE 0.9 % IV SOLN
Freq: Once | INTRAVENOUS | Status: AC
  Filled 2019-11-16: qty 250

## 2019-11-16 MED ORDER — HEPARIN SOD (PORK) LOCK FLUSH 100 UNIT/ML IV SOLN
500.0000 [IU] | Freq: Once | INTRAVENOUS | Status: AC | PRN
  Administered 2019-11-16: 500 [IU]
  Filled 2019-11-16: qty 5

## 2019-11-16 NOTE — Patient Instructions (Signed)
[place] Discharge Instructions for Patients Receiving Chemotherapy  Today you received the following chemotherapy agent Etoposide and  Cisplatin & bleomycin  To help prevent nausea and vomiting after your treatment, we encourage you to take your nausea medication as directed.  If you develop nausea and vomiting that is not controlled by your nausea medication, call the clinic.   BELOW ARE SYMPTOMS THAT SHOULD BE REPORTED IMMEDIATELY:  *FEVER GREATER THAN 100.5 F  *CHILLS WITH OR WITHOUT FEVER  NAUSEA AND VOMITING THAT IS NOT CONTROLLED WITH YOUR NAUSEA MEDICATION  *UNUSUAL SHORTNESS OF BREATH  *UNUSUAL BRUISING OR BLEEDING  TENDERNESS IN MOUTH AND THROAT WITH OR WITHOUT PRESENCE OF ULCERS  *URINARY PROBLEMS  *BOWEL PROBLEMS  UNUSUAL RASH Items with * indicate a potential emergency and should be followed up as soon as possible.  Feel free to call the clinic should you have any questions or concerns. The clinic phone number is [phone].  Please show the [place] at check-in to the Emergency Department and triage nurse.  Bleomycin injection What is this medicine? BLEOMYCIN (blee oh MYE sin) is a chemotherapy drug. It is used to treat many kinds of cancer like lymphoma, cervical cancer, head and neck cancer, and testicular cancer. It is also used to prevent and to treat fluid build-up around the lungs caused by some cancers. This medicine may be used for other purposes; ask your health care provider or pharmacist if you have questions. COMMON BRAND NAME(S): Blenoxane What should I tell my health care provider before I take this medicine? They need to know if you have any of these conditions:  cigarette smoker  kidney disease  lung disease  recent or ongoing radiation therapy  an unusual or allergic reaction to bleomycin, other chemotherapy agents, other medicines, foods, dyes, or preservatives  pregnant or trying to get  pregnant  breast-feeding How should I use this medicine? This drug is given as an infusion into a vein or a body cavity. It can also be given as an injection into a muscle or under the skin. It is administered in a hospital or clinic by a specially trained health care professional. Talk to your pediatrician regarding the use of this medicine in children. Special care may be needed. Overdosage: If you think you have taken too much of this medicine contact a poison control center or emergency room at once. NOTE: This medicine is only for you. Do not share this medicine with others. What if I miss a dose? It is important not to miss your dose. Call your doctor or health care professional if you are unable to keep an appointment. What may interact with this medicine?  certain antibiotics given by injection  cisplatin  cyclosporine  diuretics  foscarnet  medicines to increase blood counts like filgrastim, pegfilgrastim, sargramostim  vaccines This list may not describe all possible interactions. Give your health care provider a list of all the medicines, herbs, non-prescription drugs, or dietary supplements you use. Also tell them if you smoke, drink alcohol, or use illegal drugs. Some items may interact with your medicine. What should I watch for while using this medicine? Visit your doctor for checks on your progress. This drug may make you feel generally unwell. This is not uncommon, as chemotherapy can affect healthy cells as well as cancer cells. Report any side effects. Continue your course of treatment even though you feel ill unless your doctor tells you to stop. Call your doctor or health care  professional for advice if you get a fever, chills or sore throat, or other symptoms of a cold or flu. Do not treat yourself. This drug decreases your body's ability to fight infections. Try to avoid being around people who are sick. Avoid taking products that contain aspirin, acetaminophen,  ibuprofen, naproxen, or ketoprofen unless instructed by your doctor. These medicines may hide a fever. Do not become pregnant while taking this medicine. Women should inform their doctor if they wish to become pregnant or think they might be pregnant. There is a potential for serious side effects to an unborn child. Talk to your health care professional or pharmacist for more information. Do not breast-feed an infant while taking this medicine. There is a maximum amount of this medicine you should receive throughout your life. The amount depends on the medical condition being treated and your overall health. Your doctor will watch how much of this medicine you receive in your lifetime. Tell your doctor if you have taken this medicine before. What side effects may I notice from receiving this medicine? Side effects that you should report to your doctor or health care professional as soon as possible:  allergic reactions like skin rash, itching or hives, swelling of the face, lips, or tongue  breathing problems  chest pain  confusion  cough  fast, irregular heartbeat  feeling faint or lightheaded, falls  fever or chills  mouth sores  pain, tingling, numbness in the hands or feet  trouble passing urine or change in the amount of urine  yellowing of the eyes or skin Side effects that usually do not require medical attention (report to your doctor or health care professional if they continue or are bothersome):  darker skin color  hair loss  irritation at site where injected  loss of appetite  nail changes  nausea and vomiting  weight loss This list may not describe all possible side effects. Call your doctor for medical advice about side effects. You may report side effects to FDA at 1-800-FDA-1088. Where should I keep my medicine? This drug is given in a hospital or clinic and will not be stored at home. NOTE: This sheet is a summary. It may not cover all possible  information. If you have questions about this medicine, talk to your doctor, pharmacist, or health care provider.  2020 Elsevier/Gold Standard (2012-04-21 09:36:48)

## 2019-11-16 NOTE — Progress Notes (Signed)
Met with patient at registration to introduce myself as Arboriculturist and to offer available resources.  Discussed one-time $1000 [employer] grant to assist with personal expenses while going through treatment.  Gave him my card if interested in applying and for any additional financial questions or concerns.

## 2019-11-17 ENCOUNTER — Inpatient Hospital Stay: Payer: 59

## 2019-11-17 ENCOUNTER — Other Ambulatory Visit: Payer: Self-pay

## 2019-11-17 ENCOUNTER — Other Ambulatory Visit: Payer: 59

## 2019-11-17 VITALS — BP 123/77 | HR 71 | Temp 98.1°F | Resp 17

## 2019-11-17 DIAGNOSIS — C6292 Malignant neoplasm of left testis, unspecified whether descended or undescended: Secondary | ICD-10-CM

## 2019-11-17 DIAGNOSIS — Z5111 Encounter for antineoplastic chemotherapy: Secondary | ICD-10-CM | POA: Diagnosis not present

## 2019-11-17 MED ORDER — SODIUM CHLORIDE 0.9% FLUSH
10.0000 mL | INTRAVENOUS | Status: DC | PRN
  Administered 2019-11-17: 10 mL
  Filled 2019-11-17: qty 10

## 2019-11-17 MED ORDER — SODIUM CHLORIDE 0.9 % IV SOLN
100.0000 mg/m2 | Freq: Once | INTRAVENOUS | Status: AC
  Administered 2019-11-17: 190 mg via INTRAVENOUS
  Filled 2019-11-17: qty 9.5

## 2019-11-17 MED ORDER — SODIUM CHLORIDE 0.9 % IV SOLN
10.0000 mg | Freq: Once | INTRAVENOUS | Status: AC
  Administered 2019-11-17: 10 mg via INTRAVENOUS
  Filled 2019-11-17: qty 10

## 2019-11-17 MED ORDER — SODIUM CHLORIDE 0.9 % IV SOLN
16.0000 mg | Freq: Once | INTRAVENOUS | Status: AC
  Administered 2019-11-17: 16 mg via INTRAVENOUS
  Filled 2019-11-17: qty 8

## 2019-11-17 MED ORDER — SODIUM CHLORIDE 0.9 % IV SOLN
20.0000 mg/m2 | Freq: Once | INTRAVENOUS | Status: AC
  Administered 2019-11-17: 37 mg via INTRAVENOUS
  Filled 2019-11-17: qty 37

## 2019-11-17 MED ORDER — HEPARIN SOD (PORK) LOCK FLUSH 100 UNIT/ML IV SOLN
500.0000 [IU] | Freq: Once | INTRAVENOUS | Status: AC | PRN
  Administered 2019-11-17: 500 [IU]
  Filled 2019-11-17: qty 5

## 2019-11-17 MED ORDER — SODIUM CHLORIDE 0.9 % IV SOLN
150.0000 mg | Freq: Once | INTRAVENOUS | Status: AC
  Administered 2019-11-17: 150 mg via INTRAVENOUS
  Filled 2019-11-17: qty 150

## 2019-11-17 MED ORDER — SODIUM CHLORIDE 0.9 % IV SOLN
Freq: Once | INTRAVENOUS | Status: AC
  Filled 2019-11-17: qty 10

## 2019-11-17 MED ORDER — SODIUM CHLORIDE 0.9 % IV SOLN
Freq: Once | INTRAVENOUS | Status: AC
  Filled 2019-11-17: qty 250

## 2019-11-18 ENCOUNTER — Inpatient Hospital Stay: Payer: 59

## 2019-11-18 ENCOUNTER — Other Ambulatory Visit: Payer: Self-pay

## 2019-11-18 VITALS — BP 117/77 | HR 60 | Temp 98.2°F | Resp 18

## 2019-11-18 DIAGNOSIS — C6292 Malignant neoplasm of left testis, unspecified whether descended or undescended: Secondary | ICD-10-CM

## 2019-11-18 DIAGNOSIS — Z5111 Encounter for antineoplastic chemotherapy: Secondary | ICD-10-CM | POA: Diagnosis not present

## 2019-11-18 MED ORDER — SODIUM CHLORIDE 0.9 % IV SOLN
20.0000 mg/m2 | Freq: Once | INTRAVENOUS | Status: AC
  Administered 2019-11-18: 37 mg via INTRAVENOUS
  Filled 2019-11-18: qty 37

## 2019-11-18 MED ORDER — SODIUM CHLORIDE 0.9% FLUSH
10.0000 mL | INTRAVENOUS | Status: DC | PRN
  Administered 2019-11-18: 10 mL
  Filled 2019-11-18: qty 10

## 2019-11-18 MED ORDER — HEPARIN SOD (PORK) LOCK FLUSH 100 UNIT/ML IV SOLN
500.0000 [IU] | Freq: Once | INTRAVENOUS | Status: AC | PRN
  Administered 2019-11-18: 500 [IU]
  Filled 2019-11-18: qty 5

## 2019-11-18 MED ORDER — SODIUM CHLORIDE 0.9 % IV SOLN
100.0000 mg/m2 | Freq: Once | INTRAVENOUS | Status: AC
  Administered 2019-11-18: 190 mg via INTRAVENOUS
  Filled 2019-11-18: qty 9.5

## 2019-11-18 MED ORDER — SODIUM CHLORIDE 0.9 % IV SOLN
Freq: Once | INTRAVENOUS | Status: AC
  Filled 2019-11-18: qty 250

## 2019-11-18 MED ORDER — SODIUM CHLORIDE 0.9 % IV SOLN
Freq: Once | INTRAVENOUS | Status: AC
  Filled 2019-11-18: qty 10

## 2019-11-18 MED ORDER — SODIUM CHLORIDE 0.9 % IV SOLN
10.0000 mg | Freq: Once | INTRAVENOUS | Status: AC
  Administered 2019-11-18: 10 mg via INTRAVENOUS
  Filled 2019-11-18: qty 10

## 2019-11-18 MED ORDER — ALTEPLASE 2 MG IJ SOLR
INTRAMUSCULAR | Status: AC
  Filled 2019-11-18: qty 2

## 2019-11-18 NOTE — Patient Instructions (Signed)
[facility] Discharge Instructions for Patients Receiving Chemotherapy  Today you received the following chemotherapy agents Cisplatin, Etoposide  To help prevent nausea and vomiting after your treatment, we encourage you to take your nausea medication as directed.  If you develop nausea and vomiting that is not controlled by your nausea medication, call the clinic.   BELOW ARE SYMPTOMS THAT SHOULD BE REPORTED IMMEDIATELY:  *FEVER GREATER THAN 100.5 F  *CHILLS WITH OR WITHOUT FEVER  NAUSEA AND VOMITING THAT IS NOT CONTROLLED WITH YOUR NAUSEA MEDICATION  *UNUSUAL SHORTNESS OF BREATH  *UNUSUAL BRUISING OR BLEEDING  TENDERNESS IN MOUTH AND THROAT WITH OR WITHOUT PRESENCE OF ULCERS  *URINARY PROBLEMS  *BOWEL PROBLEMS  UNUSUAL RASH Items with * indicate a potential emergency and should be followed up as soon as possible.  Feel free to call the clinic should you have any questions or concerns. The clinic phone number is [phone].  Please show the CHEMO ALERT CARD at check-in to the Emergency Department and triage nurse.   

## 2019-11-19 ENCOUNTER — Other Ambulatory Visit: Payer: 59

## 2019-11-19 ENCOUNTER — Other Ambulatory Visit: Payer: Self-pay

## 2019-11-19 ENCOUNTER — Inpatient Hospital Stay: Payer: 59

## 2019-11-19 VITALS — BP 128/76 | HR 75 | Temp 98.0°F | Resp 16

## 2019-11-19 DIAGNOSIS — Z23 Encounter for immunization: Secondary | ICD-10-CM

## 2019-11-19 DIAGNOSIS — C6292 Malignant neoplasm of left testis, unspecified whether descended or undescended: Secondary | ICD-10-CM

## 2019-11-19 DIAGNOSIS — Z5111 Encounter for antineoplastic chemotherapy: Secondary | ICD-10-CM | POA: Diagnosis not present

## 2019-11-19 MED ORDER — SODIUM CHLORIDE 0.9 % IV SOLN
20.0000 mg/m2 | Freq: Once | INTRAVENOUS | Status: AC
  Administered 2019-11-19: 37 mg via INTRAVENOUS
  Filled 2019-11-19: qty 37

## 2019-11-19 MED ORDER — INFLUENZA VAC SPLIT QUAD 0.5 ML IM SUSY
0.5000 mL | PREFILLED_SYRINGE | Freq: Once | INTRAMUSCULAR | Status: AC
  Administered 2019-11-19: 0.5 mL via INTRAMUSCULAR

## 2019-11-19 MED ORDER — SODIUM CHLORIDE 0.9 % IV SOLN
Freq: Once | INTRAVENOUS | Status: AC
  Filled 2019-11-19: qty 250

## 2019-11-19 MED ORDER — HEPARIN SOD (PORK) LOCK FLUSH 100 UNIT/ML IV SOLN
500.0000 [IU] | Freq: Once | INTRAVENOUS | Status: AC | PRN
  Administered 2019-11-19: 500 [IU]
  Filled 2019-11-19: qty 5

## 2019-11-19 MED ORDER — SODIUM CHLORIDE 0.9 % IV SOLN
10.0000 mg | Freq: Once | INTRAVENOUS | Status: AC
  Administered 2019-11-19: 10 mg via INTRAVENOUS
  Filled 2019-11-19: qty 10

## 2019-11-19 MED ORDER — SODIUM CHLORIDE 0.9 % IV SOLN
16.0000 mg | Freq: Once | INTRAVENOUS | Status: AC
  Administered 2019-11-19: 16 mg via INTRAVENOUS
  Filled 2019-11-19: qty 8

## 2019-11-19 MED ORDER — INFLUENZA VAC SPLIT QUAD 0.5 ML IM SUSY
PREFILLED_SYRINGE | INTRAMUSCULAR | Status: AC
  Filled 2019-11-19: qty 0.5

## 2019-11-19 MED ORDER — SODIUM CHLORIDE 0.9% FLUSH
10.0000 mL | INTRAVENOUS | Status: DC | PRN
  Administered 2019-11-19: 10 mL
  Filled 2019-11-19: qty 10

## 2019-11-19 MED ORDER — SODIUM CHLORIDE 0.9 % IV SOLN
100.0000 mg/m2 | Freq: Once | INTRAVENOUS | Status: AC
  Administered 2019-11-19: 190 mg via INTRAVENOUS
  Filled 2019-11-19: qty 9.5

## 2019-11-19 MED ORDER — SODIUM CHLORIDE 0.9 % IV SOLN
Freq: Once | INTRAVENOUS | Status: AC
  Filled 2019-11-19: qty 10

## 2019-11-19 MED ORDER — SODIUM CHLORIDE 0.9 % IV SOLN
150.0000 mg | Freq: Once | INTRAVENOUS | Status: AC
  Administered 2019-11-19: 150 mg via INTRAVENOUS
  Filled 2019-11-19: qty 150

## 2019-11-19 NOTE — Patient Instructions (Signed)
[place] Discharge Instructions for Patients Receiving Chemotherapy  Today you received the following chemotherapy agents: etoposide and cisplatin.  To help prevent nausea and vomiting after your treatment, we encourage you to take your nausea medication as directed.   If you develop nausea and vomiting that is not controlled by your nausea medication, call the clinic.   BELOW ARE SYMPTOMS THAT SHOULD BE REPORTED IMMEDIATELY:  *FEVER GREATER THAN 100.5 F  *CHILLS WITH OR WITHOUT FEVER  NAUSEA AND VOMITING THAT IS NOT CONTROLLED WITH YOUR NAUSEA MEDICATION  *UNUSUAL SHORTNESS OF BREATH  *UNUSUAL BRUISING OR BLEEDING  TENDERNESS IN MOUTH AND THROAT WITH OR WITHOUT PRESENCE OF ULCERS  *URINARY PROBLEMS  *BOWEL PROBLEMS  UNUSUAL RASH Items with * indicate a potential emergency and should be followed up as soon as possible.  Feel free to call the clinic should you have any questions or concerns. The clinic phone number is [phone].  Please show the [place] at check-in to the Emergency Department and triage nurse.

## 2019-11-23 ENCOUNTER — Inpatient Hospital Stay: Payer: 59

## 2019-11-23 ENCOUNTER — Ambulatory Visit: Payer: 59 | Admitting: Oncology

## 2019-11-23 ENCOUNTER — Telehealth: Payer: Self-pay

## 2019-11-23 ENCOUNTER — Inpatient Hospital Stay (HOSPITAL_BASED_OUTPATIENT_CLINIC_OR_DEPARTMENT_OTHER): Payer: 59 | Admitting: Oncology

## 2019-11-23 ENCOUNTER — Other Ambulatory Visit: Payer: 59

## 2019-11-23 ENCOUNTER — Ambulatory Visit: Payer: 59

## 2019-11-23 ENCOUNTER — Other Ambulatory Visit: Payer: Self-pay

## 2019-11-23 VITALS — BP 137/91 | HR 112 | Temp 98.7°F | Resp 18 | Wt 162.6 lb

## 2019-11-23 VITALS — HR 88

## 2019-11-23 DIAGNOSIS — C6292 Malignant neoplasm of left testis, unspecified whether descended or undescended: Secondary | ICD-10-CM

## 2019-11-23 DIAGNOSIS — Z95828 Presence of other vascular implants and grafts: Secondary | ICD-10-CM

## 2019-11-23 DIAGNOSIS — Z5111 Encounter for antineoplastic chemotherapy: Secondary | ICD-10-CM | POA: Diagnosis not present

## 2019-11-23 LAB — CBC WITH DIFFERENTIAL (CANCER CENTER ONLY)
Abs Immature Granulocytes: 0.03 10*3/uL (ref 0.00–0.07)
Basophils Absolute: 0 10*3/uL (ref 0.0–0.1)
Basophils Relative: 0 %
Eosinophils Absolute: 0 10*3/uL (ref 0.0–0.5)
Eosinophils Relative: 1 %
HCT: 34.9 % — ABNORMAL LOW (ref 39.0–52.0)
Hemoglobin: 12.4 g/dL — ABNORMAL LOW (ref 13.0–17.0)
Immature Granulocytes: 1 %
Lymphocytes Relative: 34 %
Lymphs Abs: 1.6 10*3/uL (ref 0.7–4.0)
MCH: 30 pg (ref 26.0–34.0)
MCHC: 35.5 g/dL (ref 30.0–36.0)
MCV: 84.3 fL (ref 80.0–100.0)
Monocytes Absolute: 0 10*3/uL — ABNORMAL LOW (ref 0.1–1.0)
Monocytes Relative: 1 %
Neutro Abs: 2.9 10*3/uL (ref 1.7–7.7)
Neutrophils Relative %: 63 %
Platelet Count: 165 10*3/uL (ref 150–400)
RBC: 4.14 MIL/uL — ABNORMAL LOW (ref 4.22–5.81)
RDW: 10.8 % — ABNORMAL LOW (ref 11.5–15.5)
WBC Count: 4.6 10*3/uL (ref 4.0–10.5)
nRBC: 0 % (ref 0.0–0.2)

## 2019-11-23 LAB — CMP (CANCER CENTER ONLY)
ALT: 20 U/L (ref 0–44)
AST: 16 U/L (ref 15–41)
Albumin: 4 g/dL (ref 3.5–5.0)
Alkaline Phosphatase: 43 U/L (ref 38–126)
Anion gap: 8 (ref 5–15)
BUN: 20 mg/dL (ref 6–20)
CO2: 26 mmol/L (ref 22–32)
Calcium: 8.9 mg/dL (ref 8.9–10.3)
Chloride: 103 mmol/L (ref 98–111)
Creatinine: 0.97 mg/dL (ref 0.61–1.24)
GFR, Estimated: >60 mL/min (ref >60)
Glucose, Bld: 129 mg/dL — ABNORMAL HIGH (ref 70–99)
Potassium: 3.9 mmol/L (ref 3.5–5.1)
Sodium: 137 mmol/L (ref 135–145)
Total Bilirubin: <0.2 mg/dL — ABNORMAL LOW (ref 0.3–1.2)
Total Protein: 6.9 g/dL (ref 6.5–8.1)

## 2019-11-23 MED ORDER — SODIUM CHLORIDE 0.9 % IV SOLN
30.0000 [IU] | Freq: Once | INTRAVENOUS | Status: AC
  Administered 2019-11-23: 30 [IU] via INTRAVENOUS
  Filled 2019-11-23: qty 10

## 2019-11-23 MED ORDER — SODIUM CHLORIDE 0.9% FLUSH
10.0000 mL | Freq: Once | INTRAVENOUS | Status: AC
  Administered 2019-11-23: 10 mL
  Filled 2019-11-23: qty 10

## 2019-11-23 MED ORDER — SODIUM CHLORIDE 0.9 % IV SOLN
Freq: Once | INTRAVENOUS | Status: AC
  Filled 2019-11-23: qty 250

## 2019-11-23 MED ORDER — PROCHLORPERAZINE MALEATE 10 MG PO TABS
10.0000 mg | ORAL_TABLET | Freq: Once | ORAL | Status: AC
  Administered 2019-11-23: 10 mg via ORAL

## 2019-11-23 MED ORDER — SODIUM CHLORIDE 0.9% FLUSH
10.0000 mL | INTRAVENOUS | Status: DC | PRN
  Filled 2019-11-23: qty 10

## 2019-11-23 MED ORDER — HEPARIN SOD (PORK) LOCK FLUSH 100 UNIT/ML IV SOLN
500.0000 [IU] | Freq: Once | INTRAVENOUS | Status: DC | PRN
  Filled 2019-11-23: qty 5

## 2019-11-23 NOTE — Telephone Encounter (Signed)
Per Dr. Alen Blew, patient needs PFT's scheduled for 11/24 or 11/26 and on 12/16. Spoke to [place] in Respiratory Therapy and there is no availability on 11/24 or 11/26. Dr. Alen Blew made aware and patient scheduled for 11/23 at 12:00 and 12/16 at 9:00. Patient is required to have a COVID test prior to PFT. COVID test scheduled for 11/27/19 at 9:30 am and 12/21/19 at 1:00 pm. Called patient and he is aware of all appointment dates, times, and locations.

## 2019-11-23 NOTE — Patient Instructions (Signed)
[facility] [place] Discharge Instructions for Patients Receiving Chemotherapy  Today you received the following chemotherapy agent bleomycin  To help prevent nausea and vomiting after your treatment, we encourage you to take your nausea medication as directed.  If you develop nausea and vomiting that is not controlled by your nausea medication, call the clinic.   BELOW ARE SYMPTOMS THAT SHOULD BE REPORTED IMMEDIATELY:  *FEVER GREATER THAN 100.5 F  *CHILLS WITH OR WITHOUT FEVER  NAUSEA AND VOMITING THAT IS NOT CONTROLLED WITH YOUR NAUSEA MEDICATION  *UNUSUAL SHORTNESS OF BREATH  *UNUSUAL BRUISING OR BLEEDING  TENDERNESS IN MOUTH AND THROAT WITH OR WITHOUT PRESENCE OF ULCERS  *URINARY PROBLEMS  *BOWEL PROBLEMS  UNUSUAL RASH Items with * indicate a potential emergency and should be followed up as soon as possible.  Feel free to call the clinic should you have any questions or concerns. The clinic phone number is [phone].  Please show the [place] at check-in to the Emergency Department and triage nurse.  Bleomycin injection What is this medicine? BLEOMYCIN (blee oh MYE sin) is a chemotherapy drug. It is used to treat many kinds of cancer like lymphoma, cervical cancer, head and neck cancer, and testicular cancer. It is also used to prevent and to treat fluid build-up around the lungs caused by some cancers. This medicine may be used for other purposes; ask your health care provider or pharmacist if you have questions. COMMON BRAND NAME(S): Blenoxane What should I tell my health care provider before I take this medicine? They need to know if you have any of these conditions:  cigarette smoker  kidney disease  lung disease  recent or ongoing radiation therapy  an unusual or allergic reaction to bleomycin, other chemotherapy agents, other medicines, foods, dyes, or preservatives  pregnant or trying to get pregnant  breast-feeding How should I use  this medicine? This drug is given as an infusion into a vein or a body cavity. It can also be given as an injection into a muscle or under the skin. It is administered in a hospital or clinic by a specially trained health care professional. Talk to your pediatrician regarding the use of this medicine in children. Special care may be needed. Overdosage: If you think you have taken too much of this medicine contact a poison control center or emergency room at once. NOTE: This medicine is only for you. Do not share this medicine with others. What if I miss a dose? It is important not to miss your dose. Call your doctor or health care professional if you are unable to keep an appointment. What may interact with this medicine?  certain antibiotics given by injection  cisplatin  cyclosporine  diuretics  foscarnet  medicines to increase blood counts like filgrastim, pegfilgrastim, sargramostim  vaccines This list may not describe all possible interactions. Give your health care provider a list of all the medicines, herbs, non-prescription drugs, or dietary supplements you use. Also tell them if you smoke, drink alcohol, or use illegal drugs. Some items may interact with your medicine. What should I watch for while using this medicine? Visit your doctor for checks on your progress. This drug may make you feel generally unwell. This is not uncommon, as chemotherapy can affect healthy cells as well as cancer cells. Report any side effects. Continue your course of treatment even though you feel ill unless your doctor tells you to stop. Call your doctor or health care professional for advice if you  get a fever, chills or sore throat, or other symptoms of a cold or flu. Do not treat yourself. This drug decreases your body's ability to fight infections. Try to avoid being around people who are sick. Avoid taking products that contain aspirin, acetaminophen, ibuprofen, naproxen, or ketoprofen unless  instructed by your doctor. These medicines may hide a fever. Do not become pregnant while taking this medicine. Women should inform their doctor if they wish to become pregnant or think they might be pregnant. There is a potential for serious side effects to an unborn child. Talk to your health care professional or pharmacist for more information. Do not breast-feed an infant while taking this medicine. There is a maximum amount of this medicine you should receive throughout your life. The amount depends on the medical condition being treated and your overall health. Your doctor will watch how much of this medicine you receive in your lifetime. Tell your doctor if you have taken this medicine before. What side effects may I notice from receiving this medicine? Side effects that you should report to your doctor or health care professional as soon as possible:  allergic reactions like skin rash, itching or hives, swelling of the face, lips, or tongue  breathing problems  chest pain  confusion  cough  fast, irregular heartbeat  feeling faint or lightheaded, falls  fever or chills  mouth sores  pain, tingling, numbness in the hands or feet  trouble passing urine or change in the amount of urine  yellowing of the eyes or skin Side effects that usually do not require medical attention (report to your doctor or health care professional if they continue or are bothersome):  darker skin color  hair loss  irritation at site where injected  loss of appetite  nail changes  nausea and vomiting  weight loss This list may not describe all possible side effects. Call your doctor for medical advice about side effects. You may report side effects to FDA at 1-800-FDA-1088. Where should I keep my medicine? This drug is given in a hospital or clinic and will not be stored at home. NOTE: This sheet is a summary. It may not cover all possible information. If you have questions about this  medicine, talk to your doctor, pharmacist, or health care provider.  2020 Elsevier/Gold Standard (2012-04-21 09:36:48)

## 2019-11-23 NOTE — Progress Notes (Signed)
Hematology and Oncology Follow Up Visit  Cory Powell [id number] 1990/11/15 29 y.o. 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  He was found to have 3.3 x 5.0 left-sided retroperitoneal nodal mass.   Prior Therapy:  He is status post orchiectomy completed on October 19, 2019.  With the final pathology showed 20% embryonal and 80% seminoma and that T2 lesion.  Current therapy: BEP chemotherapy started on November 15, 2019.  Is here for day 9 of cycle 1.  Interim History: Cory Powell returns today for a follow-up visit.  Since the last visit, he has started for cycle of chemotherapy without any major complications.  He denies any vomiting and very mild nausea that is manageable with eating food regularly and very little Compazine use.  He denies any abdominal pain or infusion related complications.     Medications: I have reviewed the patient's current medications.  Current Outpatient Medications  Medication Sig Dispense Refill  . lidocaine-prilocaine (EMLA) cream Apply 1 application topically as needed. 30 g 0  . prochlorperazine (COMPAZINE) 10 MG tablet Take 1 tablet (10 mg total) by mouth every 6 (six) hours as needed for nausea or vomiting. 30 tablet 0   No current facility-administered medications for this visit.     Allergies: No Known Allergies    Physical Exam: Blood pressure (!) 137/91, pulse (!) 112, temperature 98.7 F (37.1 C), temperature source Tympanic, resp. rate 18, weight 162 lb 9.6 oz (73.8 kg), SpO2 100 %.   ECOG:    General appearance: Comfortable appearing without any discomfort Head: Normocephalic without any trauma Oropharynx: Mucous membranes are moist and pink without any thrush or ulcers. Eyes: Pupils are equal and round reactive to light. Lymph  nodes: No cervical, supraclavicular, inguinal or axillary lymphadenopathy.   Heart:regular rate and rhythm.  S1 and S2 without leg edema. Lung: Clear without any rhonchi or wheezes.  No dullness to percussion. Abdomin: Soft, nontender, nondistended with good bowel sounds.  No hepatosplenomegaly. Musculoskeletal: No joint deformity or effusion.  Full range of motion noted. Neurological: No deficits noted on motor, sensory and deep tendon reflex exam. Skin: No petechial rash or dryness.  Appeared moist.      Lab Results: Lab Results  Component Value Date   WBC 7.2 11/15/2019   HGB 14.1 11/15/2019   HCT 39.1 11/15/2019   MCV 83.9 11/15/2019   PLT 266 11/15/2019     Chemistry      Component Value Date/Time   NA 137 11/15/2019 0819   K 3.9 11/15/2019 0819   CL 104 11/15/2019 0819   CO2 26 11/15/2019 0819   BUN 20 11/15/2019 0819   CREATININE 1.05 11/15/2019 0819      Component Value Date/Time   CALCIUM 9.9 11/15/2019 0819   ALKPHOS 44 11/15/2019 0819   AST 23 11/15/2019 0819   ALT 22 11/15/2019 0819   BILITOT 0.7 11/15/2019 0819      Results for Cory Powell, Cory M "[name]" (MRN [id number]) as of 11/23/2019 11:31  Ref. Range 11/15/2019 08:19  AFP, Serum, Tumor Marker Latest Ref Range: 0.0 - 8.3 ng/mL 4.5  hCG Quant Latest Ref Range: 0 - 3 mIU/mL 1    Impression and Plan:   29 year old man with:  1.    T2N2 left testicular cancer diagnosed in October 2021.  He was  found to have stage IIB with 20% embryonal component and 80% seminoma and a left-sided retroperitoneal nodal mass.    He is currently receiving BEP chemotherapy without any major complications.  Risks and benefits of proceeding with day 9 with bleomycin were reviewed today.  Plan is to complete 3 cycles of therapy and update his staging after that.  He is agreeable to proceed at this time.  2.  IV access: Port-A-Cath placed with currently in use without any issues.  3.  Antiemetics: Compazine is available to  him and currently managing his nausea reasonably well.  4.  Fertility issues: This was discussed in detail and the sperm banking was offered and deferred.  5.  Renal surveillance: Baseline kidney function is normal and will be monitored periodically.  6.  Pulmonary toxicity surveillance: DLCO at baseline is normal and will continue to monitor on bleomycin.  I will repeat pulmonary function test before the next cycle.  7.  Follow-up: He will return next week to complete cycle 1 and on November 29 for the start of cycle 2.  30  minutes were spent on this encounter.  The time was dedicated to reviewing his disease status, addressing complications related to his cancer and cancer therapy and future plan of care review.    Zola Button, MD 11/16/202111:29 AM

## 2019-11-27 ENCOUNTER — Other Ambulatory Visit (HOSPITAL_COMMUNITY)
Admission: RE | Admit: 2019-11-27 | Discharge: 2019-11-27 | Disposition: A | Discharge status: Home / Self Care | Payer: 59 | Source: Ambulatory Visit | Attending: Oncology | Admitting: Oncology

## 2019-11-27 DIAGNOSIS — Z01812 Encounter for preprocedural laboratory examination: Secondary | ICD-10-CM | POA: Diagnosis present

## 2019-11-27 DIAGNOSIS — Z20822 Contact with and (suspected) exposure to covid-19: Secondary | ICD-10-CM | POA: Diagnosis not present

## 2019-11-27 LAB — SARS CORONAVIRUS 2 (TAT 6-24 HRS): SARS Coronavirus 2: NEGATIVE

## 2019-11-30 ENCOUNTER — Other Ambulatory Visit: Payer: 59

## 2019-11-30 ENCOUNTER — Ambulatory Visit (HOSPITAL_COMMUNITY)
Admission: RE | Admit: 2019-11-30 | Discharge: 2019-11-30 | Disposition: A | Discharge status: Home / Self Care | Payer: 59 | Source: Ambulatory Visit | Attending: Oncology | Admitting: Oncology

## 2019-11-30 ENCOUNTER — Inpatient Hospital Stay: Payer: 59

## 2019-11-30 ENCOUNTER — Telehealth: Payer: Self-pay

## 2019-11-30 ENCOUNTER — Ambulatory Visit: Payer: 59

## 2019-11-30 ENCOUNTER — Other Ambulatory Visit: Payer: Self-pay

## 2019-11-30 VITALS — BP 118/79 | HR 95 | Temp 99.6°F | Resp 18

## 2019-11-30 DIAGNOSIS — C6292 Malignant neoplasm of left testis, unspecified whether descended or undescended: Secondary | ICD-10-CM

## 2019-11-30 DIAGNOSIS — Z5111 Encounter for antineoplastic chemotherapy: Secondary | ICD-10-CM | POA: Diagnosis not present

## 2019-11-30 DIAGNOSIS — Z95828 Presence of other vascular implants and grafts: Secondary | ICD-10-CM

## 2019-11-30 LAB — CMP (CANCER CENTER ONLY)
ALT: 18 U/L (ref 0–44)
AST: 17 U/L (ref 15–41)
Albumin: 3.8 g/dL (ref 3.5–5.0)
Alkaline Phosphatase: 49 U/L (ref 38–126)
Anion gap: 8 (ref 5–15)
BUN: 17 mg/dL (ref 6–20)
CO2: 25 mmol/L (ref 22–32)
Calcium: 9 mg/dL (ref 8.9–10.3)
Chloride: 104 mmol/L (ref 98–111)
Creatinine: 1.14 mg/dL (ref 0.61–1.24)
GFR, Estimated: >60 mL/min (ref >60)
Glucose, Bld: 97 mg/dL (ref 70–99)
Potassium: 4.3 mmol/L (ref 3.5–5.1)
Sodium: 137 mmol/L (ref 135–145)
Total Bilirubin: 0.5 mg/dL (ref 0.3–1.2)
Total Protein: 7 g/dL (ref 6.5–8.1)

## 2019-11-30 LAB — CBC WITH DIFFERENTIAL (CANCER CENTER ONLY)
Abs Immature Granulocytes: 0 10*3/uL (ref 0.00–0.07)
Basophils Absolute: 0 10*3/uL (ref 0.0–0.1)
Basophils Relative: 1 %
Eosinophils Absolute: 0 10*3/uL (ref 0.0–0.5)
Eosinophils Relative: 1 %
HCT: 29.4 % — ABNORMAL LOW (ref 39.0–52.0)
Hemoglobin: 10.5 g/dL — ABNORMAL LOW (ref 13.0–17.0)
Immature Granulocytes: 0 %
Lymphocytes Relative: 71 %
Lymphs Abs: 1 10*3/uL (ref 0.7–4.0)
MCH: 30.1 pg (ref 26.0–34.0)
MCHC: 35.7 g/dL (ref 30.0–36.0)
MCV: 84.2 fL (ref 80.0–100.0)
Monocytes Absolute: 0.2 10*3/uL (ref 0.1–1.0)
Monocytes Relative: 13 %
Neutro Abs: 0.2 10*3/uL — CL (ref 1.7–7.7)
Neutrophils Relative %: 14 %
Platelet Count: 156 10*3/uL (ref 150–400)
RBC: 3.49 MIL/uL — ABNORMAL LOW (ref 4.22–5.81)
RDW: 10.8 % — ABNORMAL LOW (ref 11.5–15.5)
WBC Count: 1.3 10*3/uL — ABNORMAL LOW (ref 4.0–10.5)
nRBC: 0 % (ref 0.0–0.2)

## 2019-11-30 LAB — PULMONARY FUNCTION TEST
DL/VA % pred: 96 %
DL/VA: 4.84 ml/min/mmHg/L
DLCO cor % pred: 100 %
DLCO cor: 28.92 ml/min/mmHg
DLCO unc % pred: 86 %
DLCO unc: 24.91 ml/min/mmHg
FEF 25-75 Pre: 4.29 L/sec
FEF2575-%Pred-Pre: 104 %
FEV1-%Pred-Pre: 106 %
FEV1-Pre: 4.24 L
FEV1FVC-%Pred-Pre: 98 %
FEV6-%Pred-Pre: 109 %
FEV6-Pre: 5.21 L
FEV6FVC-%Pred-Pre: 101 %
FVC-%Pred-Pre: 109 %
FVC-Pre: 5.29 L
Pre FEV1/FVC ratio: 80 %
Pre FEV6/FVC Ratio: 100 %

## 2019-11-30 LAB — MAGNESIUM: Magnesium: 2 mg/dL (ref 1.7–2.4)

## 2019-11-30 MED ORDER — SODIUM CHLORIDE 0.9% FLUSH
10.0000 mL | INTRAVENOUS | Status: DC | PRN
  Administered 2019-11-30: 10 mL
  Filled 2019-11-30: qty 10

## 2019-11-30 MED ORDER — PROCHLORPERAZINE MALEATE 10 MG PO TABS
10.0000 mg | ORAL_TABLET | Freq: Once | ORAL | Status: AC
  Administered 2019-11-30: 10 mg via ORAL

## 2019-11-30 MED ORDER — SODIUM CHLORIDE 0.9 % IV SOLN
Freq: Once | INTRAVENOUS | Status: AC
  Filled 2019-11-30: qty 250

## 2019-11-30 MED ORDER — HEPARIN SOD (PORK) LOCK FLUSH 100 UNIT/ML IV SOLN
500.0000 [IU] | Freq: Once | INTRAVENOUS | Status: AC | PRN
  Administered 2019-11-30: 500 [IU]
  Filled 2019-11-30: qty 5

## 2019-11-30 MED ORDER — SODIUM CHLORIDE 0.9 % IV SOLN
30.0000 [IU] | Freq: Once | INTRAVENOUS | Status: AC
  Administered 2019-11-30: 30 [IU] via INTRAVENOUS
  Filled 2019-11-30: qty 10

## 2019-11-30 MED ORDER — SODIUM CHLORIDE 0.9% FLUSH
10.0000 mL | Freq: Once | INTRAVENOUS | Status: AC
  Administered 2019-11-30: 10 mL
  Filled 2019-11-30: qty 10

## 2019-11-30 MED ORDER — PROCHLORPERAZINE MALEATE 10 MG PO TABS
ORAL_TABLET | ORAL | Status: AC
  Filled 2019-11-30: qty 1

## 2019-11-30 NOTE — Telephone Encounter (Signed)
CRITICAL VALUE STICKER  CRITICAL VALUE: ANC = 0.2  RECEIVER (on-site recipient of call): Yetta Glassman, CMA  DATE & TIME NOTIFIED: 11/30/19 at 9:43am  MESSENGER (representative from lab): Rosann Auerbach  MD NOTIFIED: Dr. Alen Blew  TIME OF NOTIFICATION: 9:45am  RESPONSE: Notification given to Carlyon Prows, RN for follow-up woth provider.

## 2019-11-30 NOTE — Progress Notes (Signed)
Okay to treat with ANC 0.2 per Dr. Alen Blew.

## 2019-11-30 NOTE — Telephone Encounter (Signed)
Dr. Alen Blew notified about Pt's [place] 0.2 @ 954

## 2019-12-03 ENCOUNTER — Other Ambulatory Visit: Payer: 59

## 2019-12-06 ENCOUNTER — Inpatient Hospital Stay (HOSPITAL_BASED_OUTPATIENT_CLINIC_OR_DEPARTMENT_OTHER): Payer: 59 | Admitting: Oncology

## 2019-12-06 ENCOUNTER — Inpatient Hospital Stay: Payer: 59

## 2019-12-06 ENCOUNTER — Other Ambulatory Visit: Payer: Self-pay

## 2019-12-06 VITALS — BP 126/79 | HR 80 | Temp 98.9°F | Resp 18 | Ht 66.0 in | Wt 164.1 lb

## 2019-12-06 DIAGNOSIS — Z5111 Encounter for antineoplastic chemotherapy: Secondary | ICD-10-CM | POA: Diagnosis not present

## 2019-12-06 DIAGNOSIS — Z95828 Presence of other vascular implants and grafts: Secondary | ICD-10-CM

## 2019-12-06 DIAGNOSIS — C6292 Malignant neoplasm of left testis, unspecified whether descended or undescended: Secondary | ICD-10-CM

## 2019-12-06 LAB — CMP (CANCER CENTER ONLY)
ALT: 16 U/L (ref 0–44)
AST: 18 U/L (ref 15–41)
Albumin: 3.7 g/dL (ref 3.5–5.0)
Alkaline Phosphatase: 51 U/L (ref 38–126)
Anion gap: 7 (ref 5–15)
BUN: 19 mg/dL (ref 6–20)
CO2: 26 mmol/L (ref 22–32)
Calcium: 9.6 mg/dL (ref 8.9–10.3)
Chloride: 106 mmol/L (ref 98–111)
Creatinine: 0.86 mg/dL (ref 0.61–1.24)
GFR, Estimated: >60 mL/min (ref >60)
Glucose, Bld: 93 mg/dL (ref 70–99)
Potassium: 4.2 mmol/L (ref 3.5–5.1)
Sodium: 139 mmol/L (ref 135–145)
Total Bilirubin: 0.2 mg/dL — ABNORMAL LOW (ref 0.3–1.2)
Total Protein: 7.4 g/dL (ref 6.5–8.1)

## 2019-12-06 LAB — CBC WITH DIFFERENTIAL (CANCER CENTER ONLY)
Abs Immature Granulocytes: 0.23 K/uL — ABNORMAL HIGH (ref 0.00–0.07)
Basophils Absolute: 0.1 K/uL (ref 0.0–0.1)
Basophils Relative: 2 %
Eosinophils Absolute: 0 K/uL (ref 0.0–0.5)
Eosinophils Relative: 0 %
HCT: 32.7 % — ABNORMAL LOW (ref 39.0–52.0)
Hemoglobin: 11.6 g/dL — ABNORMAL LOW (ref 13.0–17.0)
Immature Granulocytes: 6 %
Lymphocytes Relative: 39 %
Lymphs Abs: 1.6 K/uL (ref 0.7–4.0)
MCH: 30.3 pg (ref 26.0–34.0)
MCHC: 35.5 g/dL (ref 30.0–36.0)
MCV: 85.4 fL (ref 80.0–100.0)
Monocytes Absolute: 0.6 K/uL (ref 0.1–1.0)
Monocytes Relative: 15 %
Neutro Abs: 1.6 K/uL — ABNORMAL LOW (ref 1.7–7.7)
Neutrophils Relative %: 38 %
Platelet Count: 291 K/uL (ref 150–400)
RBC: 3.83 MIL/uL — ABNORMAL LOW (ref 4.22–5.81)
RDW: 12 % (ref 11.5–15.5)
WBC Count: 4.1 K/uL (ref 4.0–10.5)
nRBC: 0 % (ref 0.0–0.2)

## 2019-12-06 MED ORDER — SODIUM CHLORIDE 0.9 % IV SOLN
10.0000 mg | Freq: Once | INTRAVENOUS | Status: AC
  Administered 2019-12-06: 10 mg via INTRAVENOUS
  Filled 2019-12-06: qty 10

## 2019-12-06 MED ORDER — SODIUM CHLORIDE 0.9 % IV SOLN
Freq: Once | INTRAVENOUS | Status: AC
  Filled 2019-12-06: qty 250

## 2019-12-06 MED ORDER — SODIUM CHLORIDE 0.9% FLUSH
10.0000 mL | Freq: Once | INTRAVENOUS | Status: AC
  Administered 2019-12-06: 10 mL
  Filled 2019-12-06: qty 10

## 2019-12-06 MED ORDER — SODIUM CHLORIDE 0.9 % IV SOLN
150.0000 mg | Freq: Once | INTRAVENOUS | Status: AC
  Administered 2019-12-06: 150 mg via INTRAVENOUS
  Filled 2019-12-06: qty 150

## 2019-12-06 MED ORDER — SODIUM CHLORIDE 0.9 % IV SOLN
100.0000 mg/m2 | Freq: Once | INTRAVENOUS | Status: AC
  Administered 2019-12-06: 190 mg via INTRAVENOUS
  Filled 2019-12-06: qty 9.5

## 2019-12-06 MED ORDER — SODIUM CHLORIDE 0.9 % IV SOLN
16.0000 mg | Freq: Once | INTRAVENOUS | Status: AC
  Administered 2019-12-06: 16 mg via INTRAVENOUS
  Filled 2019-12-06: qty 8

## 2019-12-06 MED ORDER — SODIUM CHLORIDE 0.9 % IV SOLN
20.0000 mg/m2 | Freq: Once | INTRAVENOUS | Status: AC
  Administered 2019-12-06: 37 mg via INTRAVENOUS
  Filled 2019-12-06 (×3): qty 37

## 2019-12-06 MED ORDER — SODIUM CHLORIDE 0.9 % IV SOLN
Freq: Once | INTRAVENOUS | Status: AC
  Filled 2019-12-06: qty 10

## 2019-12-06 MED ORDER — SODIUM CHLORIDE 0.9% FLUSH
10.0000 mL | INTRAVENOUS | Status: DC | PRN
  Administered 2019-12-06: 10 mL
  Filled 2019-12-06: qty 10

## 2019-12-06 MED ORDER — HEPARIN SOD (PORK) LOCK FLUSH 100 UNIT/ML IV SOLN
500.0000 [IU] | Freq: Once | INTRAVENOUS | Status: AC | PRN
  Administered 2019-12-06: 500 [IU]
  Filled 2019-12-06: qty 5

## 2019-12-06 NOTE — Patient Instructions (Signed)

## 2019-12-06 NOTE — Patient Instructions (Signed)
[facility] Discharge Instructions for Patients Receiving Chemotherapy  Today you received the following chemotherapy agents Cisplatin, Etoposide  To help prevent nausea and vomiting after your treatment, we encourage you to take your nausea medication as directed.  If you develop nausea and vomiting that is not controlled by your nausea medication, call the clinic.   BELOW ARE SYMPTOMS THAT SHOULD BE REPORTED IMMEDIATELY:  *FEVER GREATER THAN 100.5 F  *CHILLS WITH OR WITHOUT FEVER  NAUSEA AND VOMITING THAT IS NOT CONTROLLED WITH YOUR NAUSEA MEDICATION  *UNUSUAL SHORTNESS OF BREATH  *UNUSUAL BRUISING OR BLEEDING  TENDERNESS IN MOUTH AND THROAT WITH OR WITHOUT PRESENCE OF ULCERS  *URINARY PROBLEMS  *BOWEL PROBLEMS  UNUSUAL RASH Items with * indicate a potential emergency and should be followed up as soon as possible.  Feel free to call the clinic should you have any questions or concerns. The clinic phone number is [phone].  Please show the CHEMO ALERT CARD at check-in to the Emergency Department and triage nurse.   

## 2019-12-06 NOTE — Progress Notes (Signed)
Hematology and Oncology Follow Up Visit  Cory Powell [id number] 12/05/1990 29 y.o. 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  He was found to have 3.3 x 5.0 left-sided retroperitoneal nodal mass in October 2021.   Prior Therapy:  He is status post orchiectomy completed on October 19, 2019.  With the final pathology showed 20% embryonal and 80% seminoma and that T2 lesion.  Current therapy: BEP chemotherapy started on November 15, 2019.  He is here for day 1 of cycle 2 of therapy.  Interim History: Mr. Cory Powell presents today for return evaluation.  Since the last visit, he reports no major changes in his health.  He completed the first cycle of chemotherapy without any residual complaints.  He denies any nausea vomiting or abdominal pain.  He denies any hospitalization or illnesses.  He continues to enjoy excellent performance status and quality of life.     Medications: Unchanged on review. Current Outpatient Medications  Medication Sig Dispense Refill  . lidocaine-prilocaine (EMLA) cream Apply 1 application topically as needed. 30 g 0  . prochlorperazine (COMPAZINE) 10 MG tablet Take 1 tablet (10 mg total) by mouth every 6 (six) hours as needed for nausea or vomiting. 30 tablet 0   No current facility-administered medications for this visit.     Allergies: No Known Allergies    Physical Exam: Blood pressure 126/79, pulse 80, temperature 98.9 F (37.2 C), temperature source Tympanic, resp. rate 18, height 5\' 6"  (1.676 m), weight 164 lb 1.6 oz (74.4 kg), SpO2 99 %.   ECOG: 0    General appearance: Alert, awake without any distress. Head: Atraumatic without abnormalities Oropharynx: Without any thrush or ulcers. Eyes: No scleral icterus. Lymph nodes: No lymphadenopathy noted in the cervical,  supraclavicular, or axillary nodes Heart:regular rate and rhythm, without any murmurs or gallops.   Lung: Clear to auscultation without any rhonchi, wheezes or dullness to percussion. Abdomin: Soft, nontender without any shifting dullness or ascites. Musculoskeletal: No clubbing or cyanosis. Neurological: No motor or sensory deficits. Skin: No rashes or lesions.      Lab Results: Lab Results  Component Value Date   WBC 1.3 (L) 11/30/2019   HGB 10.5 (L) 11/30/2019   HCT 29.4 (L) 11/30/2019   MCV 84.2 11/30/2019   PLT 156 11/30/2019     Chemistry      Component Value Date/Time   NA 137 11/30/2019 0904   K 4.3 11/30/2019 0904   CL 104 11/30/2019 0904   CO2 25 11/30/2019 0904   BUN 17 11/30/2019 0904   CREATININE 1.14 11/30/2019 0904      Component Value Date/Time   CALCIUM 9.0 11/30/2019 0904   ALKPHOS 49 11/30/2019 0904   AST 17 11/30/2019 0904   ALT 18 11/30/2019 0904   BILITOT 0.5 11/30/2019 0904         Impression and Plan:   29 year old man with:  1.    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  He was found to have 20% embryonal component and 80% seminoma and a left-sided retroperitoneal nodal mass.    He has completed cycle 1 of therapy without any major complications.  Risks and benefits of proceeding with cycle 2 of chemotherapy were reviewed.  Potential complications including nausea, vomiting, myelosuppression, pulmonary toxicity as well as neutropenia and sepsis were reiterated.  The plan is  to complete 3 cycles of therapy before repeat staging scans in the future.  He is agreeable to proceed at this time.  Laboratory data from today showed adequate hematological parameters including normal white cell count hemoglobin and platelets.  2.  IV access: Port-A-Cath currently in use without any complications.  3.  Antiemetics: Very mild nausea reported without any vomiting.  Compazine has been effective.  4.  Fertility  issues: He has deferred sperm banking before the start of chemotherapy.  5.  Renal surveillance: His kidney function continues to be most normal range after platinum exposure.  6.  Pulmonary toxicity surveillance: DLCO after cycle 1 of therapy remains adequate after bleomycin.  7.  Follow-up: On December 7 for the next chemotherapy date.  30  minutes were dedicated to this visit.  The time was spent on reviewing his disease status, discussing treatment options and outlining future plan of care.    Cory Button, MD 11/29/20217:56 AM

## 2019-12-06 NOTE — Progress Notes (Signed)
Pt stable at discharge.  

## 2019-12-07 ENCOUNTER — Other Ambulatory Visit: Payer: Self-pay

## 2019-12-07 ENCOUNTER — Inpatient Hospital Stay: Payer: 59

## 2019-12-07 VITALS — BP 136/83 | HR 81 | Temp 98.7°F | Resp 18

## 2019-12-07 DIAGNOSIS — C6292 Malignant neoplasm of left testis, unspecified whether descended or undescended: Secondary | ICD-10-CM

## 2019-12-07 DIAGNOSIS — Z5111 Encounter for antineoplastic chemotherapy: Secondary | ICD-10-CM | POA: Diagnosis not present

## 2019-12-07 MED ORDER — SODIUM CHLORIDE 0.9% FLUSH
10.0000 mL | INTRAVENOUS | Status: DC | PRN
  Administered 2019-12-07: 10 mL
  Filled 2019-12-07: qty 10

## 2019-12-07 MED ORDER — SODIUM CHLORIDE 0.9 % IV SOLN
100.0000 mg/m2 | Freq: Once | INTRAVENOUS | Status: AC
  Administered 2019-12-07: 190 mg via INTRAVENOUS
  Filled 2019-12-07: qty 9.5

## 2019-12-07 MED ORDER — SODIUM CHLORIDE 0.9 % IV SOLN
20.0000 mg/m2 | Freq: Once | INTRAVENOUS | Status: AC
  Administered 2019-12-07: 37 mg via INTRAVENOUS
  Filled 2019-12-07: qty 37

## 2019-12-07 MED ORDER — SODIUM CHLORIDE 0.9 % IV SOLN
10.0000 mg | Freq: Once | INTRAVENOUS | Status: AC
  Administered 2019-12-07: 10 mg via INTRAVENOUS
  Filled 2019-12-07: qty 10

## 2019-12-07 MED ORDER — SODIUM CHLORIDE 0.9 % IV SOLN
30.0000 [IU] | Freq: Once | INTRAVENOUS | Status: AC
  Administered 2019-12-07: 30 [IU] via INTRAVENOUS
  Filled 2019-12-07: qty 10

## 2019-12-07 MED ORDER — SODIUM CHLORIDE 0.9 % IV SOLN
Freq: Once | INTRAVENOUS | Status: AC
  Filled 2019-12-07: qty 10

## 2019-12-07 MED ORDER — SODIUM CHLORIDE 0.9 % IV SOLN
Freq: Once | INTRAVENOUS | Status: AC
  Filled 2019-12-07: qty 250

## 2019-12-07 MED ORDER — HEPARIN SOD (PORK) LOCK FLUSH 100 UNIT/ML IV SOLN
500.0000 [IU] | Freq: Once | INTRAVENOUS | Status: AC | PRN
  Administered 2019-12-07: 500 [IU]
  Filled 2019-12-07: qty 5

## 2019-12-07 NOTE — Patient Instructions (Addendum)
[facility] Discharge Instructions for Patients Receiving Chemotherapy  Today you received the following chemotherapy agents: Bleomycin, Cisplatin, Etoposide  To help prevent nausea and vomiting after your treatment, we encourage you to take your nausea medication as directed.    If you develop nausea and vomiting that is not controlled by your nausea medication, call the clinic.   BELOW ARE SYMPTOMS THAT SHOULD BE REPORTED IMMEDIATELY:  *FEVER GREATER THAN 100.5 F  *CHILLS WITH OR WITHOUT FEVER  NAUSEA AND VOMITING THAT IS NOT CONTROLLED WITH YOUR NAUSEA MEDICATION  *UNUSUAL SHORTNESS OF BREATH  *UNUSUAL BRUISING OR BLEEDING  TENDERNESS IN MOUTH AND THROAT WITH OR WITHOUT PRESENCE OF ULCERS  *URINARY PROBLEMS  *BOWEL PROBLEMS  UNUSUAL RASH Items with * indicate a potential emergency and should be followed up as soon as possible.  Feel free to call the clinic should you have any questions or concerns. The clinic phone number is [phone].  Please show the [facility] at check-in to the Emergency Department and triage nurse.   [facility] Discharge Instructions for Patients Receiving Chemotherapy  Today you received the following chemotherapy agent bleomycin  To help prevent nausea and vomiting after your treatment, we encourage you to take your nausea medication as directed.  If you develop nausea and vomiting that is not controlled by your nausea medication, call the clinic.   BELOW ARE SYMPTOMS THAT SHOULD BE REPORTED IMMEDIATELY:  *FEVER GREATER THAN 100.5 F  *CHILLS WITH OR WITHOUT FEVER  NAUSEA AND VOMITING THAT IS NOT CONTROLLED WITH YOUR NAUSEA MEDICATION  *UNUSUAL SHORTNESS OF BREATH  *UNUSUAL BRUISING OR BLEEDING  TENDERNESS IN MOUTH AND THROAT WITH OR WITHOUT PRESENCE OF ULCERS  *URINARY PROBLEMS  *BOWEL PROBLEMS  UNUSUAL RASH Items with * indicate a potential emergency and should be followed up as soon as  possible.  Feel free to call the clinic should you have any questions or concerns. The clinic phone number is [phone].  Please show the [facility] at check-in to the Emergency Department and triage nurse.

## 2019-12-08 ENCOUNTER — Other Ambulatory Visit: Payer: Self-pay

## 2019-12-08 ENCOUNTER — Inpatient Hospital Stay: Payer: 59 | Attending: Oncology

## 2019-12-08 VITALS — BP 115/72 | HR 79 | Temp 98.3°F | Resp 18

## 2019-12-08 DIAGNOSIS — C6292 Malignant neoplasm of left testis, unspecified whether descended or undescended: Secondary | ICD-10-CM | POA: Insufficient documentation

## 2019-12-08 DIAGNOSIS — Z79899 Other long term (current) drug therapy: Secondary | ICD-10-CM | POA: Diagnosis not present

## 2019-12-08 DIAGNOSIS — Z5111 Encounter for antineoplastic chemotherapy: Secondary | ICD-10-CM | POA: Diagnosis not present

## 2019-12-08 MED ORDER — SODIUM CHLORIDE 0.9 % IV SOLN
20.0000 mg/m2 | Freq: Once | INTRAVENOUS | Status: AC
  Administered 2019-12-08: 37 mg via INTRAVENOUS
  Filled 2019-12-08: qty 37

## 2019-12-08 MED ORDER — SODIUM CHLORIDE 0.9 % IV SOLN
150.0000 mg | Freq: Once | INTRAVENOUS | Status: AC
  Administered 2019-12-08: 150 mg via INTRAVENOUS
  Filled 2019-12-08: qty 150

## 2019-12-08 MED ORDER — SODIUM CHLORIDE 0.9 % IV SOLN
16.0000 mg | Freq: Once | INTRAVENOUS | Status: AC
  Administered 2019-12-08: 16 mg via INTRAVENOUS
  Filled 2019-12-08: qty 8

## 2019-12-08 MED ORDER — SODIUM CHLORIDE 0.9 % IV SOLN
Freq: Once | INTRAVENOUS | Status: AC
  Filled 2019-12-08: qty 10

## 2019-12-08 MED ORDER — SODIUM CHLORIDE 0.9% FLUSH
10.0000 mL | INTRAVENOUS | Status: DC | PRN
  Administered 2019-12-08: 10 mL
  Filled 2019-12-08: qty 10

## 2019-12-08 MED ORDER — SODIUM CHLORIDE 0.9 % IV SOLN
10.0000 mg | Freq: Once | INTRAVENOUS | Status: AC
  Administered 2019-12-08: 10 mg via INTRAVENOUS
  Filled 2019-12-08: qty 10

## 2019-12-08 MED ORDER — SODIUM CHLORIDE 0.9 % IV SOLN
Freq: Once | INTRAVENOUS | Status: AC
  Filled 2019-12-08: qty 250

## 2019-12-08 MED ORDER — HEPARIN SOD (PORK) LOCK FLUSH 100 UNIT/ML IV SOLN
500.0000 [IU] | Freq: Once | INTRAVENOUS | Status: AC | PRN
  Administered 2019-12-08: 500 [IU]
  Filled 2019-12-08: qty 5

## 2019-12-08 MED ORDER — SODIUM CHLORIDE 0.9 % IV SOLN
100.0000 mg/m2 | Freq: Once | INTRAVENOUS | Status: AC
  Administered 2019-12-08: 190 mg via INTRAVENOUS
  Filled 2019-12-08: qty 9.5

## 2019-12-08 NOTE — Patient Instructions (Signed)
[facility] Discharge Instructions for Patients Receiving Chemotherapy  Today you received the following chemotherapy agents Cisplatin, Etoposide  To help prevent nausea and vomiting after your treatment, we encourage you to take your nausea medication as directed.  If you develop nausea and vomiting that is not controlled by your nausea medication, call the clinic.   BELOW ARE SYMPTOMS THAT SHOULD BE REPORTED IMMEDIATELY:  *FEVER GREATER THAN 100.5 F  *CHILLS WITH OR WITHOUT FEVER  NAUSEA AND VOMITING THAT IS NOT CONTROLLED WITH YOUR NAUSEA MEDICATION  *UNUSUAL SHORTNESS OF BREATH  *UNUSUAL BRUISING OR BLEEDING  TENDERNESS IN MOUTH AND THROAT WITH OR WITHOUT PRESENCE OF ULCERS  *URINARY PROBLEMS  *BOWEL PROBLEMS  UNUSUAL RASH Items with * indicate a potential emergency and should be followed up as soon as possible.  Feel free to call the clinic should you have any questions or concerns. The clinic phone number is [phone].  Please show the CHEMO ALERT CARD at check-in to the Emergency Department and triage nurse.   

## 2019-12-09 ENCOUNTER — Other Ambulatory Visit: Payer: Self-pay

## 2019-12-09 ENCOUNTER — Inpatient Hospital Stay: Payer: 59

## 2019-12-09 VITALS — BP 109/90 | HR 64 | Temp 98.2°F | Resp 18

## 2019-12-09 DIAGNOSIS — C6292 Malignant neoplasm of left testis, unspecified whether descended or undescended: Secondary | ICD-10-CM

## 2019-12-09 DIAGNOSIS — Z5111 Encounter for antineoplastic chemotherapy: Secondary | ICD-10-CM | POA: Diagnosis not present

## 2019-12-09 MED ORDER — HEPARIN SOD (PORK) LOCK FLUSH 100 UNIT/ML IV SOLN
500.0000 [IU] | Freq: Once | INTRAVENOUS | Status: AC | PRN
  Administered 2019-12-09: 500 [IU]
  Filled 2019-12-09: qty 5

## 2019-12-09 MED ORDER — SODIUM CHLORIDE 0.9% FLUSH
10.0000 mL | INTRAVENOUS | Status: DC | PRN
  Administered 2019-12-09: 10 mL
  Filled 2019-12-09: qty 10

## 2019-12-09 MED ORDER — SODIUM CHLORIDE 0.9 % IV SOLN
20.0000 mg/m2 | Freq: Once | INTRAVENOUS | Status: AC
  Administered 2019-12-09: 37 mg via INTRAVENOUS
  Filled 2019-12-09: qty 37

## 2019-12-09 MED ORDER — SODIUM CHLORIDE 0.9 % IV SOLN
Freq: Once | INTRAVENOUS | Status: AC
  Filled 2019-12-09: qty 250

## 2019-12-09 MED ORDER — SODIUM CHLORIDE 0.9 % IV SOLN
10.0000 mg | Freq: Once | INTRAVENOUS | Status: AC
  Administered 2019-12-09: 10 mg via INTRAVENOUS
  Filled 2019-12-09: qty 10

## 2019-12-09 MED ORDER — SODIUM CHLORIDE 0.9 % IV SOLN
100.0000 mg/m2 | Freq: Once | INTRAVENOUS | Status: AC
  Administered 2019-12-09: 190 mg via INTRAVENOUS
  Filled 2019-12-09: qty 9.5

## 2019-12-09 MED ORDER — SODIUM CHLORIDE 0.9 % IV SOLN
Freq: Once | INTRAVENOUS | Status: AC
  Filled 2019-12-09: qty 10

## 2019-12-09 NOTE — Patient Instructions (Signed)
[facility] Discharge Instructions for Patients Receiving Chemotherapy  Today you received the following chemotherapy agents Cisplatin, Etoposide  To help prevent nausea and vomiting after your treatment, we encourage you to take your nausea medication as directed.  If you develop nausea and vomiting that is not controlled by your nausea medication, call the clinic.   BELOW ARE SYMPTOMS THAT SHOULD BE REPORTED IMMEDIATELY:  *FEVER GREATER THAN 100.5 F  *CHILLS WITH OR WITHOUT FEVER  NAUSEA AND VOMITING THAT IS NOT CONTROLLED WITH YOUR NAUSEA MEDICATION  *UNUSUAL SHORTNESS OF BREATH  *UNUSUAL BRUISING OR BLEEDING  TENDERNESS IN MOUTH AND THROAT WITH OR WITHOUT PRESENCE OF ULCERS  *URINARY PROBLEMS  *BOWEL PROBLEMS  UNUSUAL RASH Items with * indicate a potential emergency and should be followed up as soon as possible.  Feel free to call the clinic should you have any questions or concerns. The clinic phone number is [phone].  Please show the CHEMO ALERT CARD at check-in to the Emergency Department and triage nurse.   

## 2019-12-10 ENCOUNTER — Inpatient Hospital Stay: Payer: 59

## 2019-12-10 ENCOUNTER — Other Ambulatory Visit: Payer: Self-pay

## 2019-12-10 VITALS — BP 126/74 | HR 65 | Temp 98.1°F | Resp 17

## 2019-12-10 DIAGNOSIS — Z5111 Encounter for antineoplastic chemotherapy: Secondary | ICD-10-CM | POA: Diagnosis not present

## 2019-12-10 DIAGNOSIS — C6292 Malignant neoplasm of left testis, unspecified whether descended or undescended: Secondary | ICD-10-CM

## 2019-12-10 MED ORDER — HEPARIN SOD (PORK) LOCK FLUSH 100 UNIT/ML IV SOLN
500.0000 [IU] | Freq: Once | INTRAVENOUS | Status: AC | PRN
  Administered 2019-12-10: 500 [IU]
  Filled 2019-12-10: qty 5

## 2019-12-10 MED ORDER — SODIUM CHLORIDE 0.9 % IV SOLN
100.0000 mg/m2 | Freq: Once | INTRAVENOUS | Status: AC
  Administered 2019-12-10: 190 mg via INTRAVENOUS
  Filled 2019-12-10: qty 9.5

## 2019-12-10 MED ORDER — SODIUM CHLORIDE 0.9 % IV SOLN
150.0000 mg | Freq: Once | INTRAVENOUS | Status: AC
  Administered 2019-12-10: 150 mg via INTRAVENOUS
  Filled 2019-12-10: qty 150

## 2019-12-10 MED ORDER — SODIUM CHLORIDE 0.9 % IV SOLN
20.0000 mg/m2 | Freq: Once | INTRAVENOUS | Status: AC
  Administered 2019-12-10: 37 mg via INTRAVENOUS
  Filled 2019-12-10: qty 37

## 2019-12-10 MED ORDER — SODIUM CHLORIDE 0.9 % IV SOLN
16.0000 mg | Freq: Once | INTRAVENOUS | Status: AC
  Administered 2019-12-10: 16 mg via INTRAVENOUS
  Filled 2019-12-10: qty 8

## 2019-12-10 MED ORDER — SODIUM CHLORIDE 0.9 % IV SOLN
10.0000 mg | Freq: Once | INTRAVENOUS | Status: AC
  Administered 2019-12-10: 10 mg via INTRAVENOUS
  Filled 2019-12-10: qty 10

## 2019-12-10 MED ORDER — SODIUM CHLORIDE 0.9 % IV SOLN
Freq: Once | INTRAVENOUS | Status: AC
  Filled 2019-12-10: qty 10

## 2019-12-10 MED ORDER — SODIUM CHLORIDE 0.9% FLUSH
10.0000 mL | INTRAVENOUS | Status: DC | PRN
  Administered 2019-12-10: 10 mL
  Filled 2019-12-10: qty 10

## 2019-12-10 MED ORDER — SODIUM CHLORIDE 0.9 % IV SOLN
Freq: Once | INTRAVENOUS | Status: AC
  Filled 2019-12-10: qty 250

## 2019-12-10 NOTE — Patient Instructions (Signed)
[facility] Discharge Instructions for Patients Receiving Chemotherapy  Today you received the following chemotherapy agents Etoposide; Cisplatin  To help prevent nausea and vomiting after your treatment, we encourage you to take your nausea medication as directed.  If you develop nausea and vomiting that is not controlled by your nausea medication, call the clinic.   BELOW ARE SYMPTOMS THAT SHOULD BE REPORTED IMMEDIATELY:  *FEVER GREATER THAN 100.5 F  *CHILLS WITH OR WITHOUT FEVER  NAUSEA AND VOMITING THAT IS NOT CONTROLLED WITH YOUR NAUSEA MEDICATION  *UNUSUAL SHORTNESS OF BREATH  *UNUSUAL BRUISING OR BLEEDING  TENDERNESS IN MOUTH AND THROAT WITH OR WITHOUT PRESENCE OF ULCERS  *URINARY PROBLEMS  *BOWEL PROBLEMS  UNUSUAL RASH Items with * indicate a potential emergency and should be followed up as soon as possible.  Feel free to call the clinic should you have any questions or concerns. The clinic phone number is [phone].  Please show the CHEMO ALERT CARD at check-in to the Emergency Department and triage nurse.   

## 2019-12-14 ENCOUNTER — Inpatient Hospital Stay: Payer: 59

## 2019-12-14 ENCOUNTER — Other Ambulatory Visit: Payer: Self-pay

## 2019-12-14 VITALS — BP 114/80 | HR 96 | Temp 98.3°F | Resp 18 | Ht 66.0 in | Wt 164.8 lb

## 2019-12-14 DIAGNOSIS — C6292 Malignant neoplasm of left testis, unspecified whether descended or undescended: Secondary | ICD-10-CM

## 2019-12-14 DIAGNOSIS — Z5111 Encounter for antineoplastic chemotherapy: Secondary | ICD-10-CM | POA: Diagnosis not present

## 2019-12-14 DIAGNOSIS — Z95828 Presence of other vascular implants and grafts: Secondary | ICD-10-CM

## 2019-12-14 LAB — CBC WITH DIFFERENTIAL (CANCER CENTER ONLY)
Abs Immature Granulocytes: 0.04 10*3/uL (ref 0.00–0.07)
Basophils Absolute: 0 10*3/uL (ref 0.0–0.1)
Basophils Relative: 0 %
Eosinophils Absolute: 0 10*3/uL (ref 0.0–0.5)
Eosinophils Relative: 0 %
HCT: 33.4 % — ABNORMAL LOW (ref 39.0–52.0)
Hemoglobin: 11.9 g/dL — ABNORMAL LOW (ref 13.0–17.0)
Immature Granulocytes: 1 %
Lymphocytes Relative: 25 %
Lymphs Abs: 1.3 10*3/uL (ref 0.7–4.0)
MCH: 30.2 pg (ref 26.0–34.0)
MCHC: 35.6 g/dL (ref 30.0–36.0)
MCV: 84.8 fL (ref 80.0–100.0)
Monocytes Absolute: 0.2 10*3/uL (ref 0.1–1.0)
Monocytes Relative: 3 %
Neutro Abs: 3.9 10*3/uL (ref 1.7–7.7)
Neutrophils Relative %: 71 %
Platelet Count: 253 10*3/uL (ref 150–400)
RBC: 3.94 MIL/uL — ABNORMAL LOW (ref 4.22–5.81)
RDW: 11.7 % (ref 11.5–15.5)
WBC Count: 5.4 10*3/uL (ref 4.0–10.5)
nRBC: 0 % (ref 0.0–0.2)

## 2019-12-14 LAB — CMP (CANCER CENTER ONLY)
ALT: 19 U/L (ref 0–44)
AST: 16 U/L (ref 15–41)
Albumin: 3.9 g/dL (ref 3.5–5.0)
Alkaline Phosphatase: 47 U/L (ref 38–126)
Anion gap: 9 (ref 5–15)
BUN: 25 mg/dL — ABNORMAL HIGH (ref 6–20)
CO2: 26 mmol/L (ref 22–32)
Calcium: 9.2 mg/dL (ref 8.9–10.3)
Chloride: 100 mmol/L (ref 98–111)
Creatinine: 0.85 mg/dL (ref 0.61–1.24)
GFR, Estimated: >60 mL/min (ref >60)
Glucose, Bld: 102 mg/dL — ABNORMAL HIGH (ref 70–99)
Potassium: 4.2 mmol/L (ref 3.5–5.1)
Sodium: 135 mmol/L (ref 135–145)
Total Bilirubin: 0.4 mg/dL (ref 0.3–1.2)
Total Protein: 7.3 g/dL (ref 6.5–8.1)

## 2019-12-14 MED ORDER — SODIUM CHLORIDE 0.9 % IV SOLN
30.0000 [IU] | Freq: Once | INTRAVENOUS | Status: AC
  Administered 2019-12-14: 30 [IU] via INTRAVENOUS
  Filled 2019-12-14: qty 10

## 2019-12-14 MED ORDER — SODIUM CHLORIDE 0.9% FLUSH
10.0000 mL | INTRAVENOUS | Status: DC | PRN
  Administered 2019-12-14: 10 mL
  Filled 2019-12-14: qty 10

## 2019-12-14 MED ORDER — PROCHLORPERAZINE MALEATE 10 MG PO TABS
10.0000 mg | ORAL_TABLET | Freq: Once | ORAL | Status: AC
  Administered 2019-12-14: 10 mg via ORAL

## 2019-12-14 MED ORDER — HEPARIN SOD (PORK) LOCK FLUSH 100 UNIT/ML IV SOLN
500.0000 [IU] | Freq: Once | INTRAVENOUS | Status: AC | PRN
  Administered 2019-12-14: 500 [IU]
  Filled 2019-12-14: qty 5

## 2019-12-14 MED ORDER — SODIUM CHLORIDE 0.9 % IV SOLN
Freq: Once | INTRAVENOUS | Status: AC
  Filled 2019-12-14: qty 250

## 2019-12-14 MED ORDER — PROCHLORPERAZINE MALEATE 10 MG PO TABS
ORAL_TABLET | ORAL | Status: AC
  Filled 2019-12-14: qty 1

## 2019-12-14 MED ORDER — SODIUM CHLORIDE 0.9% FLUSH
10.0000 mL | Freq: Once | INTRAVENOUS | Status: AC
  Administered 2019-12-14: 10 mL
  Filled 2019-12-14: qty 10

## 2019-12-14 NOTE — Progress Notes (Signed)
Patient completed infusion without incident. In no visible distress at time of discharge. Ambulated out of cancer center. AVS declined. 

## 2019-12-14 NOTE — Patient Instructions (Signed)
[facility] Discharge Instructions for Patients Receiving Chemotherapy  Today you received the following chemotherapy agents; bleomycin.  To help prevent nausea and vomiting after your treatment, we encourage you to take your nausea medication as directed.   If you develop nausea and vomiting that is not controlled by your nausea medication, call the clinic.   BELOW ARE SYMPTOMS THAT SHOULD BE REPORTED IMMEDIATELY:  *FEVER GREATER THAN 100.5 F  *CHILLS WITH OR WITHOUT FEVER  NAUSEA AND VOMITING THAT IS NOT CONTROLLED WITH YOUR NAUSEA MEDICATION  *UNUSUAL SHORTNESS OF BREATH  *UNUSUAL BRUISING OR BLEEDING  TENDERNESS IN MOUTH AND THROAT WITH OR WITHOUT PRESENCE OF ULCERS  *URINARY PROBLEMS  *BOWEL PROBLEMS  UNUSUAL RASH Items with * indicate a potential emergency and should be followed up as soon as possible.  Feel free to call the clinic should you have any questions or concerns. The clinic phone number is [phone].  Please show the CHEMO ALERT CARD at check-in to the Emergency Department and triage nurse.   

## 2019-12-21 ENCOUNTER — Inpatient Hospital Stay: Payer: 59

## 2019-12-21 ENCOUNTER — Other Ambulatory Visit: Payer: Self-pay

## 2019-12-21 ENCOUNTER — Other Ambulatory Visit (HOSPITAL_COMMUNITY)
Admission: RE | Admit: 2019-12-21 | Discharge: 2019-12-21 | Disposition: A | Discharge status: Home / Self Care | Payer: 59 | Source: Ambulatory Visit | Attending: Oncology | Admitting: Oncology

## 2019-12-21 ENCOUNTER — Other Ambulatory Visit (HOSPITAL_COMMUNITY): Payer: 59

## 2019-12-21 VITALS — BP 110/69 | HR 78 | Temp 98.9°F | Resp 18 | Wt 164.8 lb

## 2019-12-21 DIAGNOSIS — C6292 Malignant neoplasm of left testis, unspecified whether descended or undescended: Secondary | ICD-10-CM

## 2019-12-21 DIAGNOSIS — Z01812 Encounter for preprocedural laboratory examination: Secondary | ICD-10-CM | POA: Diagnosis not present

## 2019-12-21 DIAGNOSIS — Z5111 Encounter for antineoplastic chemotherapy: Secondary | ICD-10-CM | POA: Diagnosis not present

## 2019-12-21 DIAGNOSIS — Z20822 Contact with and (suspected) exposure to covid-19: Secondary | ICD-10-CM | POA: Diagnosis not present

## 2019-12-21 LAB — CBC WITH DIFFERENTIAL (CANCER CENTER ONLY)
Abs Immature Granulocytes: 0.01 10*3/uL (ref 0.00–0.07)
Basophils Absolute: 0 10*3/uL (ref 0.0–0.1)
Basophils Relative: 1 %
Eosinophils Absolute: 0 10*3/uL (ref 0.0–0.5)
Eosinophils Relative: 0 %
HCT: 31.5 % — ABNORMAL LOW (ref 39.0–52.0)
Hemoglobin: 10.9 g/dL — ABNORMAL LOW (ref 13.0–17.0)
Immature Granulocytes: 0 %
Lymphocytes Relative: 28 %
Lymphs Abs: 0.9 10*3/uL (ref 0.7–4.0)
MCH: 30.2 pg (ref 26.0–34.0)
MCHC: 34.6 g/dL (ref 30.0–36.0)
MCV: 87.3 fL (ref 80.0–100.0)
Monocytes Absolute: 0.4 10*3/uL (ref 0.1–1.0)
Monocytes Relative: 14 %
Neutro Abs: 1.8 10*3/uL (ref 1.7–7.7)
Neutrophils Relative %: 57 %
Platelet Count: 202 10*3/uL (ref 150–400)
RBC: 3.61 MIL/uL — ABNORMAL LOW (ref 4.22–5.81)
RDW: 13 % (ref 11.5–15.5)
WBC Count: 3.2 10*3/uL — ABNORMAL LOW (ref 4.0–10.5)
nRBC: 0 % (ref 0.0–0.2)

## 2019-12-21 LAB — CMP (CANCER CENTER ONLY)
ALT: 24 U/L (ref 0–44)
AST: 17 U/L (ref 15–41)
Albumin: 3.7 g/dL (ref 3.5–5.0)
Alkaline Phosphatase: 44 U/L (ref 38–126)
Anion gap: 4 — ABNORMAL LOW (ref 5–15)
BUN: 18 mg/dL (ref 6–20)
CO2: 27 mmol/L (ref 22–32)
Calcium: 9.2 mg/dL (ref 8.9–10.3)
Chloride: 106 mmol/L (ref 98–111)
Creatinine: 0.8 mg/dL (ref 0.61–1.24)
GFR, Estimated: >60 mL/min (ref >60)
Glucose, Bld: 102 mg/dL — ABNORMAL HIGH (ref 70–99)
Potassium: 4 mmol/L (ref 3.5–5.1)
Sodium: 137 mmol/L (ref 135–145)
Total Bilirubin: 0.4 mg/dL (ref 0.3–1.2)
Total Protein: 7 g/dL (ref 6.5–8.1)

## 2019-12-21 LAB — SARS CORONAVIRUS 2 (TAT 6-24 HRS): SARS Coronavirus 2: NEGATIVE

## 2019-12-21 MED ORDER — PROCHLORPERAZINE MALEATE 10 MG PO TABS
10.0000 mg | ORAL_TABLET | Freq: Once | ORAL | Status: AC
  Administered 2019-12-21: 10:00:00 10 mg via ORAL

## 2019-12-21 MED ORDER — SODIUM CHLORIDE 0.9 % IV SOLN
Freq: Once | INTRAVENOUS | Status: AC
  Filled 2019-12-21: qty 250

## 2019-12-21 MED ORDER — SODIUM CHLORIDE 0.9 % IV SOLN
30.0000 [IU] | Freq: Once | INTRAVENOUS | Status: AC
  Administered 2019-12-21: 11:00:00 30 [IU] via INTRAVENOUS
  Filled 2019-12-21: qty 10

## 2019-12-21 MED ORDER — SODIUM CHLORIDE 0.9% FLUSH
10.0000 mL | INTRAVENOUS | Status: DC | PRN
  Administered 2019-12-21: 11:00:00 10 mL
  Filled 2019-12-21: qty 10

## 2019-12-21 MED ORDER — HEPARIN SOD (PORK) LOCK FLUSH 100 UNIT/ML IV SOLN
500.0000 [IU] | Freq: Once | INTRAVENOUS | Status: AC | PRN
  Administered 2019-12-21: 11:00:00 500 [IU]
  Filled 2019-12-21: qty 5

## 2019-12-21 MED ORDER — PROCHLORPERAZINE MALEATE 10 MG PO TABS
ORAL_TABLET | ORAL | Status: AC
  Filled 2019-12-21: qty 1

## 2019-12-21 NOTE — Patient Instructions (Signed)
[facility] Discharge Instructions for Patients Receiving Chemotherapy  Today you received the following chemotherapy agents; bleomycin.  To help prevent nausea and vomiting after your treatment, we encourage you to take your nausea medication as directed.   If you develop nausea and vomiting that is not controlled by your nausea medication, call the clinic.   BELOW ARE SYMPTOMS THAT SHOULD BE REPORTED IMMEDIATELY:  *FEVER GREATER THAN 100.5 F  *CHILLS WITH OR WITHOUT FEVER  NAUSEA AND VOMITING THAT IS NOT CONTROLLED WITH YOUR NAUSEA MEDICATION  *UNUSUAL SHORTNESS OF BREATH  *UNUSUAL BRUISING OR BLEEDING  TENDERNESS IN MOUTH AND THROAT WITH OR WITHOUT PRESENCE OF ULCERS  *URINARY PROBLEMS  *BOWEL PROBLEMS  UNUSUAL RASH Items with * indicate a potential emergency and should be followed up as soon as possible.  Feel free to call the clinic should you have any questions or concerns. The clinic phone number is [phone].  Please show the CHEMO ALERT CARD at check-in to the Emergency Department and triage nurse.   

## 2019-12-21 NOTE — Patient Instructions (Signed)

## 2019-12-23 ENCOUNTER — Other Ambulatory Visit: Payer: Self-pay

## 2019-12-23 ENCOUNTER — Ambulatory Visit (HOSPITAL_COMMUNITY)
Admission: RE | Admit: 2019-12-23 | Discharge: 2019-12-23 | Disposition: A | Discharge status: Home / Self Care | Payer: 59 | Source: Ambulatory Visit | Attending: Oncology | Admitting: Oncology

## 2019-12-23 DIAGNOSIS — C6292 Malignant neoplasm of left testis, unspecified whether descended or undescended: Secondary | ICD-10-CM | POA: Diagnosis present

## 2019-12-23 LAB — PULMONARY FUNCTION TEST
DL/VA % pred: 89 %
DL/VA: 4.46 ml/min/mmHg/L
DLCO unc % pred: 100 %
DLCO unc: 29.03 ml/min/mmHg
FEF 25-75 Pre: 4.03 L/sec
FEF2575-%Pred-Pre: 98 %
FEV1-%Pred-Pre: 101 %
FEV1-Pre: 4.04 L
FEV1FVC-%Pred-Pre: 98 %
FEV6-%Pred-Pre: 105 %
FEV6-Pre: 5.02 L
FEV6FVC-%Pred-Pre: 101 %
FVC-%Pred-Pre: 104 %
FVC-Pre: 5.02 L
Pre FEV1/FVC ratio: 81 %
Pre FEV6/FVC Ratio: 100 %

## 2019-12-24 ENCOUNTER — Telehealth: Payer: Self-pay

## 2019-12-24 NOTE — Telephone Encounter (Signed)
Per Dr. Alen Blew please treat patient with chemo on Monday 12/27/2019 despite lab results.

## 2019-12-27 ENCOUNTER — Inpatient Hospital Stay: Payer: 59

## 2019-12-27 ENCOUNTER — Other Ambulatory Visit: Payer: Self-pay

## 2019-12-27 ENCOUNTER — Other Ambulatory Visit: Payer: Self-pay | Admitting: Internal Medicine

## 2019-12-27 ENCOUNTER — Ambulatory Visit: Payer: 59 | Admitting: Oncology

## 2019-12-27 VITALS — BP 114/75 | HR 75 | Temp 97.7°F | Resp 16

## 2019-12-27 DIAGNOSIS — Z5111 Encounter for antineoplastic chemotherapy: Secondary | ICD-10-CM | POA: Diagnosis not present

## 2019-12-27 DIAGNOSIS — C6292 Malignant neoplasm of left testis, unspecified whether descended or undescended: Secondary | ICD-10-CM

## 2019-12-27 LAB — CMP (CANCER CENTER ONLY)
ALT: 22 U/L (ref 0–44)
AST: 20 U/L (ref 15–41)
Albumin: 3.7 g/dL (ref 3.5–5.0)
Alkaline Phosphatase: 45 U/L (ref 38–126)
Anion gap: 6 (ref 5–15)
BUN: 16 mg/dL (ref 6–20)
CO2: 28 mmol/L (ref 22–32)
Calcium: 9 mg/dL (ref 8.9–10.3)
Chloride: 106 mmol/L (ref 98–111)
Creatinine: 0.82 mg/dL (ref 0.61–1.24)
GFR, Estimated: >60 mL/min (ref >60)
Glucose, Bld: 95 mg/dL (ref 70–99)
Potassium: 4 mmol/L (ref 3.5–5.1)
Sodium: 140 mmol/L (ref 135–145)
Total Bilirubin: 0.3 mg/dL (ref 0.3–1.2)
Total Protein: 6.7 g/dL (ref 6.5–8.1)

## 2019-12-27 LAB — CBC WITH DIFFERENTIAL (CANCER CENTER ONLY)
Abs Immature Granulocytes: 0.05 10*3/uL (ref 0.00–0.07)
Basophils Absolute: 0 10*3/uL (ref 0.0–0.1)
Basophils Relative: 0 %
Eosinophils Absolute: 0 10*3/uL (ref 0.0–0.5)
Eosinophils Relative: 1 %
HCT: 31.8 % — ABNORMAL LOW (ref 39.0–52.0)
Hemoglobin: 11.1 g/dL — ABNORMAL LOW (ref 13.0–17.0)
Immature Granulocytes: 2 %
Lymphocytes Relative: 44 %
Lymphs Abs: 1.3 10*3/uL (ref 0.7–4.0)
MCH: 30.6 pg (ref 26.0–34.0)
MCHC: 34.9 g/dL (ref 30.0–36.0)
MCV: 87.6 fL (ref 80.0–100.0)
Monocytes Absolute: 0.7 10*3/uL (ref 0.1–1.0)
Monocytes Relative: 24 %
Neutro Abs: 0.8 10*3/uL — ABNORMAL LOW (ref 1.7–7.7)
Neutrophils Relative %: 29 %
Platelet Count: 248 10*3/uL (ref 150–400)
RBC: 3.63 MIL/uL — ABNORMAL LOW (ref 4.22–5.81)
RDW: 13.7 % (ref 11.5–15.5)
WBC Count: 2.9 10*3/uL — ABNORMAL LOW (ref 4.0–10.5)
nRBC: 0 % (ref 0.0–0.2)

## 2019-12-27 MED ORDER — SODIUM CHLORIDE 0.9 % IV SOLN
10.0000 mg | Freq: Once | INTRAVENOUS | Status: AC
  Administered 2019-12-27: 12:00:00 10 mg via INTRAVENOUS
  Filled 2019-12-27: qty 10

## 2019-12-27 MED ORDER — SODIUM CHLORIDE 0.9 % IV SOLN
16.0000 mg | Freq: Once | INTRAVENOUS | Status: AC
  Administered 2019-12-27: 12:00:00 16 mg via INTRAVENOUS
  Filled 2019-12-27: qty 8

## 2019-12-27 MED ORDER — SODIUM CHLORIDE 0.9 % IV SOLN
150.0000 mg | Freq: Once | INTRAVENOUS | Status: AC
  Administered 2019-12-27: 12:00:00 150 mg via INTRAVENOUS
  Filled 2019-12-27: qty 150

## 2019-12-27 MED ORDER — CISPLATIN CHEMO INJECTION 100MG/100ML
20.0000 mg/m2 | Freq: Once | INTRAVENOUS | Status: AC
  Administered 2019-12-27: 14:00:00 37 mg via INTRAVENOUS
  Filled 2019-12-27: qty 37

## 2019-12-27 MED ORDER — SODIUM CHLORIDE 0.9 % IV SOLN
Freq: Once | INTRAVENOUS | Status: AC
  Filled 2019-12-27: qty 10

## 2019-12-27 MED ORDER — HEPARIN SOD (PORK) LOCK FLUSH 100 UNIT/ML IV SOLN
500.0000 [IU] | Freq: Once | INTRAVENOUS | Status: AC | PRN
  Administered 2019-12-27: 17:00:00 500 [IU]
  Filled 2019-12-27: qty 5

## 2019-12-27 MED ORDER — SODIUM CHLORIDE 0.9 % IV SOLN
Freq: Once | INTRAVENOUS | Status: AC
  Filled 2019-12-27: qty 250

## 2019-12-27 MED ORDER — ETOPOSIDE CHEMO INJECTION 1 GM/50ML
100.0000 mg/m2 | Freq: Once | INTRAVENOUS | Status: AC
  Administered 2019-12-27: 13:00:00 190 mg via INTRAVENOUS
  Filled 2019-12-27: qty 9.5

## 2019-12-27 MED ORDER — SODIUM CHLORIDE 0.9% FLUSH
10.0000 mL | INTRAVENOUS | Status: DC | PRN
  Administered 2019-12-27: 17:00:00 10 mL
  Filled 2019-12-27: qty 10

## 2019-12-27 NOTE — Progress Notes (Signed)
Per progress note entered on Friday, ok to treat with low ANC.

## 2019-12-27 NOTE — Patient Instructions (Signed)
[facility] Discharge Instructions for Patients Receiving Chemotherapy  Today you received the following chemotherapy agents: etoposide and cisplatin.  To help prevent nausea and vomiting after your treatment, we encourage you to take your nausea medication as directed.   If you develop nausea and vomiting that is not controlled by your nausea medication, call the clinic.   BELOW ARE SYMPTOMS THAT SHOULD BE REPORTED IMMEDIATELY:  *FEVER GREATER THAN 100.5 F  *CHILLS WITH OR WITHOUT FEVER  NAUSEA AND VOMITING THAT IS NOT CONTROLLED WITH YOUR NAUSEA MEDICATION  *UNUSUAL SHORTNESS OF BREATH  *UNUSUAL BRUISING OR BLEEDING  TENDERNESS IN MOUTH AND THROAT WITH OR WITHOUT PRESENCE OF ULCERS  *URINARY PROBLEMS  *BOWEL PROBLEMS  UNUSUAL RASH Items with * indicate a potential emergency and should be followed up as soon as possible.  Feel free to call the clinic should you have any questions or concerns. The clinic phone number is [phone].  Please show the [facility] at check-in to the Emergency Department and triage nurse.

## 2019-12-28 ENCOUNTER — Inpatient Hospital Stay: Payer: 59

## 2019-12-28 ENCOUNTER — Inpatient Hospital Stay (HOSPITAL_BASED_OUTPATIENT_CLINIC_OR_DEPARTMENT_OTHER): Payer: 59 | Admitting: Oncology

## 2019-12-28 ENCOUNTER — Other Ambulatory Visit: Payer: Self-pay

## 2019-12-28 VITALS — BP 123/74 | HR 86 | Temp 97.8°F | Resp 13 | Ht 66.0 in | Wt 171.8 lb

## 2019-12-28 DIAGNOSIS — Z5111 Encounter for antineoplastic chemotherapy: Secondary | ICD-10-CM | POA: Diagnosis not present

## 2019-12-28 DIAGNOSIS — C6292 Malignant neoplasm of left testis, unspecified whether descended or undescended: Secondary | ICD-10-CM | POA: Diagnosis not present

## 2019-12-28 MED ORDER — DEXAMETHASONE SODIUM PHOSPHATE 100 MG/10ML IJ SOLN
10.0000 mg | Freq: Once | INTRAMUSCULAR | Status: AC
  Administered 2019-12-28: 11:00:00 10 mg via INTRAVENOUS
  Filled 2019-12-28: qty 10

## 2019-12-28 MED ORDER — SODIUM CHLORIDE 0.9% FLUSH
10.0000 mL | INTRAVENOUS | Status: DC | PRN
Start: 2019-12-28 — End: 2019-12-28
  Administered 2019-12-28: 17:00:00 10 mL
  Filled 2019-12-28: qty 10

## 2019-12-28 MED ORDER — SODIUM CHLORIDE 0.9 % IV SOLN
Freq: Once | INTRAVENOUS | Status: AC
  Filled 2019-12-28 (×2): qty 10

## 2019-12-28 MED ORDER — SODIUM CHLORIDE 0.9 % IV SOLN
Freq: Once | INTRAVENOUS | Status: AC
  Filled 2019-12-28: qty 250

## 2019-12-28 MED ORDER — SODIUM CHLORIDE 0.9 % IV SOLN
100.0000 mg/m2 | Freq: Once | INTRAVENOUS | Status: AC
  Administered 2019-12-28: 13:00:00 190 mg via INTRAVENOUS
  Filled 2019-12-28: qty 9.5

## 2019-12-28 MED ORDER — HEPARIN SOD (PORK) LOCK FLUSH 100 UNIT/ML IV SOLN
500.0000 [IU] | Freq: Once | INTRAVENOUS | Status: AC | PRN
  Administered 2019-12-28: 17:00:00 500 [IU]
  Filled 2019-12-28: qty 5

## 2019-12-28 MED ORDER — SODIUM CHLORIDE 0.9 % IV SOLN
30.0000 [IU] | Freq: Once | INTRAVENOUS | Status: AC
  Administered 2019-12-28: 12:00:00 30 [IU] via INTRAVENOUS
  Filled 2019-12-28: qty 10

## 2019-12-28 MED ORDER — SODIUM CHLORIDE 0.9 % IV SOLN
20.0000 mg/m2 | Freq: Once | INTRAVENOUS | Status: AC
  Administered 2019-12-28: 14:00:00 37 mg via INTRAVENOUS
  Filled 2019-12-28: qty 37

## 2019-12-28 NOTE — Patient Instructions (Signed)
[facility] Discharge Instructions for Patients Receiving Chemotherapy  Today you received the following chemotherapy agents: Bleomycin, Etoposide, and Cisplatin  To help prevent nausea and vomiting after your treatment, we encourage you to take your nausea medication as directed by your MD.   If you develop nausea and vomiting that is not controlled by your nausea medication, call the clinic.   BELOW ARE SYMPTOMS THAT SHOULD BE REPORTED IMMEDIATELY:  *FEVER GREATER THAN 100.5 F  *CHILLS WITH OR WITHOUT FEVER  NAUSEA AND VOMITING THAT IS NOT CONTROLLED WITH YOUR NAUSEA MEDICATION  *UNUSUAL SHORTNESS OF BREATH  *UNUSUAL BRUISING OR BLEEDING  TENDERNESS IN MOUTH AND THROAT WITH OR WITHOUT PRESENCE OF ULCERS  *URINARY PROBLEMS  *BOWEL PROBLEMS  UNUSUAL RASH Items with * indicate a potential emergency and should be followed up as soon as possible.  Feel free to call the clinic should you have any questions or concerns. The clinic phone number is [phone].  Please show the [facility] at check-in to the Emergency Department and triage nurse.

## 2019-12-28 NOTE — Progress Notes (Signed)
Hematology and Oncology Follow Up Visit  Cory Powell [id number] Apr 29, 1990 29 y.o. 12/28/2019 8:07 AM [name], DOMatthews, [place], DO   Principle Diagnosis: 29 year old man with T2N2, stage IIb left nonseminomatous testicular tumor with 20% embryonal and 80% seminoma diagnosed in October 2021.  He was found to have 3.3 x 5.0 left-sided retroperitoneal nodal mass.   Prior Therapy:  He is status post orchiectomy completed on October 19, 2019.  With the final pathology showed 20% embryonal and 80% seminoma and that T2 lesion.  Current therapy: BEP chemotherapy started on November 15, 2019.  He is here for day 2 of cycle 3 of therapy.  Interim History: Cory Powell is here for a follow-up visit.  Since the last visit, he completed 2 cycles of chemotherapy without any major complications.  He received the day 1 of cycle 3 of chemotherapy without any major complaints.  He denies any nausea, vomiting or abdominal pain.  He denies any worsening neuropathy or any hospitalizations.  His performance status quality of life remained excellent.  He had no complications related to his Port-A-Cath.    Medications: Updated on review. Current Outpatient Medications  Medication Sig Dispense Refill  . lidocaine-prilocaine (EMLA) cream Apply 1 application topically as needed. 30 g 0  . prochlorperazine (COMPAZINE) 10 MG tablet Take 1 tablet (10 mg total) by mouth every 6 (six) hours as needed for nausea or vomiting. 30 tablet 0   No current facility-administered medications for this visit.   Facility-Administered Medications Ordered in Other Visits  Medication Dose Route Frequency Provider Last Rate Last Admin  . sodium chloride flush (NS) 0.9 % injection 10 mL  10 mL Intracatheter PRN Wyatt Portela, MD   10 mL at 12/07/19 1541     Allergies: No Known Allergies    Physical Exam:    ECOG: 0    General appearance: Comfortable appearing without any discomfort Head: Normocephalic without  any trauma Oropharynx: Mucous membranes are moist and pink without any thrush or ulcers. Eyes: Pupils are equal and round reactive to light. Lymph nodes: No cervical, supraclavicular, inguinal or axillary lymphadenopathy.   Heart:regular rate and rhythm.  S1 and S2 without leg edema. Lung: Clear without any rhonchi or wheezes.  No dullness to percussion. Abdomin: Soft, nontender, nondistended with good bowel sounds.  No hepatosplenomegaly. Musculoskeletal: No joint deformity or effusion.  Full range of motion noted. Neurological: No deficits noted on motor, sensory and deep tendon reflex exam. Skin: No petechial rash or dryness.  Appeared moist.        Lab Results: Lab Results  Component Value Date   WBC 2.9 (L) 12/27/2019   HGB 11.1 (L) 12/27/2019   HCT 31.8 (L) 12/27/2019   MCV 87.6 12/27/2019   PLT 248 12/27/2019     Chemistry      Component Value Date/Time   NA 140 12/27/2019 0900   K 4.0 12/27/2019 0900   CL 106 12/27/2019 0900   CO2 28 12/27/2019 0900   BUN 16 12/27/2019 0900   CREATININE 0.82 12/27/2019 0900      Component Value Date/Time   CALCIUM 9.0 12/27/2019 0900   ALKPHOS 45 12/27/2019 0900   AST 20 12/27/2019 0900   ALT 22 12/27/2019 0900   BILITOT 0.3 12/27/2019 0900         Impression and Plan:   29 year old man with:  1.    Testicular cancer diagnosed in October 2021.  He was found to have stage IIb. left nonseminomatous cancer  with 20% embryonal component and 80% seminoma and a left-sided retroperitoneal nodal mass.    He is status post 2 cycles of BEP chemotherapy without any major complications.  Risks and benefits of continuing cycle 3 were discussed.  Laboratory data obtained on 12/27/2019 were reviewed and showed adequate parameters without any dose reduction or delay.  Complications include nausea, vomiting, myelosuppression, renal insufficiency and infertility issues were reiterated.   Upon completing cycle 3 of chemotherapy we will  update his staging scans.  He is agreeable with this plan.  2.  IV access: Port-A-Cath will be flushed after completion of chemotherapy.  3.  Antiemetics: No nausea or vomiting reported at this time.  4.  Fertility issues: The risk of infertility issues were reiterated periodically before chemo and during treatment.  He has deferred the option for sperm banking.  5.  Renal surveillance: Kidney function remained stable on platinum based therapy.  6.  Pulmonary toxicity surveillance: Pulmonary function test continue to be stable with no decline in his DLCO after 2 cycles of bleomycin based chemotherapy.  7.  Follow-up: We will continue daily chemotherapy complete cycle 3 and returned for bleomycin on December 28 and January   30  minutes were spent on this encounter.  The time was dedicated to reviewing his disease status, discussing treatment complications and future plan of care review.    Zola Button, MD 12/21/20218:07 AM

## 2019-12-28 NOTE — Progress Notes (Signed)
Per previous progress notes, ok for treatment with low ANC. Pt. stable for discharge. Left via ambulation, no respiratory distress noted.

## 2019-12-29 ENCOUNTER — Other Ambulatory Visit: Payer: Self-pay

## 2019-12-29 ENCOUNTER — Inpatient Hospital Stay: Payer: 59

## 2019-12-29 VITALS — BP 117/68 | HR 82 | Temp 98.6°F | Resp 16

## 2019-12-29 DIAGNOSIS — Z5111 Encounter for antineoplastic chemotherapy: Secondary | ICD-10-CM | POA: Diagnosis not present

## 2019-12-29 DIAGNOSIS — C6292 Malignant neoplasm of left testis, unspecified whether descended or undescended: Secondary | ICD-10-CM

## 2019-12-29 MED ORDER — SODIUM CHLORIDE 0.9 % IV SOLN
20.0000 mg/m2 | Freq: Once | INTRAVENOUS | Status: AC
  Administered 2019-12-29: 15:00:00 37 mg via INTRAVENOUS
  Filled 2019-12-29: qty 37

## 2019-12-29 MED ORDER — SODIUM CHLORIDE 0.9% FLUSH
10.0000 mL | INTRAVENOUS | Status: DC | PRN
  Administered 2019-12-29: 17:00:00 10 mL
  Filled 2019-12-29: qty 10

## 2019-12-29 MED ORDER — SODIUM CHLORIDE 0.9 % IV SOLN
10.0000 mg | Freq: Once | INTRAVENOUS | Status: AC
  Administered 2019-12-29: 12:00:00 10 mg via INTRAVENOUS
  Filled 2019-12-29: qty 10

## 2019-12-29 MED ORDER — MAGNESIUM SULFATE 2 GM/50ML IV SOLN
2.0000 g | Freq: Once | INTRAVENOUS | Status: AC
  Administered 2019-12-29: 10:00:00 2 g via INTRAVENOUS

## 2019-12-29 MED ORDER — SODIUM CHLORIDE 0.9 % IV SOLN
100.0000 mg/m2 | Freq: Once | INTRAVENOUS | Status: AC
  Administered 2019-12-29: 14:00:00 190 mg via INTRAVENOUS
  Filled 2019-12-29: qty 9.5

## 2019-12-29 MED ORDER — ONDANSETRON HCL 4 MG/2ML IJ SOLN
INTRAMUSCULAR | Status: AC
  Filled 2019-12-29: qty 2

## 2019-12-29 MED ORDER — POTASSIUM CHLORIDE IN NACL 20-0.9 MEQ/L-% IV SOLN
Freq: Once | INTRAVENOUS | Status: AC
  Filled 2019-12-29: qty 1000

## 2019-12-29 MED ORDER — HEPARIN SOD (PORK) LOCK FLUSH 100 UNIT/ML IV SOLN
500.0000 [IU] | Freq: Once | INTRAVENOUS | Status: AC | PRN
  Administered 2019-12-29: 17:00:00 500 [IU]
  Filled 2019-12-29: qty 5

## 2019-12-29 MED ORDER — MAGNESIUM SULFATE 2 GM/50ML IV SOLN
INTRAVENOUS | Status: AC
  Filled 2019-12-29: qty 50

## 2019-12-29 MED ORDER — SODIUM CHLORIDE 0.9 % IV SOLN
16.0000 mg | Freq: Once | INTRAVENOUS | Status: AC
  Administered 2019-12-29: 13:00:00 16 mg via INTRAVENOUS
  Filled 2019-12-29: qty 8

## 2019-12-29 MED ORDER — SODIUM CHLORIDE 0.9 % IV SOLN
150.0000 mg | Freq: Once | INTRAVENOUS | Status: AC
  Administered 2019-12-29: 12:00:00 150 mg via INTRAVENOUS
  Filled 2019-12-29: qty 150

## 2019-12-29 MED ORDER — SODIUM CHLORIDE 0.9 % IV SOLN
Freq: Once | INTRAVENOUS | Status: DC
  Filled 2019-12-29: qty 10

## 2019-12-29 MED ORDER — SODIUM CHLORIDE 0.9 % IV SOLN
Freq: Once | INTRAVENOUS | Status: AC
  Filled 2019-12-29: qty 250

## 2019-12-30 ENCOUNTER — Inpatient Hospital Stay: Payer: 59

## 2019-12-30 ENCOUNTER — Other Ambulatory Visit: Payer: Self-pay

## 2019-12-30 VITALS — BP 122/82 | HR 70 | Temp 98.2°F | Resp 16

## 2019-12-30 DIAGNOSIS — Z5111 Encounter for antineoplastic chemotherapy: Secondary | ICD-10-CM | POA: Diagnosis not present

## 2019-12-30 DIAGNOSIS — C6292 Malignant neoplasm of left testis, unspecified whether descended or undescended: Secondary | ICD-10-CM

## 2019-12-30 MED ORDER — SODIUM CHLORIDE 0.9 % IV SOLN
20.0000 mg/m2 | Freq: Once | INTRAVENOUS | Status: AC
  Administered 2019-12-30: 14:00:00 37 mg via INTRAVENOUS
  Filled 2019-12-30: qty 37

## 2019-12-30 MED ORDER — SODIUM CHLORIDE 0.9 % IV SOLN: Freq: Once | INTRAVENOUS | Status: DC

## 2019-12-30 MED ORDER — SODIUM CHLORIDE 0.9 % IV SOLN
100.0000 mg/m2 | Freq: Once | INTRAVENOUS | Status: AC
  Administered 2019-12-30: 13:00:00 190 mg via INTRAVENOUS
  Filled 2019-12-30: qty 9.5

## 2019-12-30 MED ORDER — SODIUM CHLORIDE 0.9 % IV SOLN
10.0000 mg | Freq: Once | INTRAVENOUS | Status: AC
  Administered 2019-12-30: 12:00:00 10 mg via INTRAVENOUS
  Filled 2019-12-30: qty 10

## 2019-12-30 MED ORDER — SODIUM CHLORIDE 0.9 % IV SOLN
Freq: Once | INTRAVENOUS | Status: AC
  Filled 2019-12-30: qty 250

## 2019-12-30 MED ORDER — POTASSIUM CHLORIDE IN NACL 20-0.9 MEQ/L-% IV SOLN
Freq: Once | INTRAVENOUS | Status: AC
  Filled 2019-12-30: qty 1000

## 2019-12-30 MED ORDER — MAGNESIUM SULFATE 2 GM/50ML IV SOLN
2.0000 g | Freq: Once | INTRAVENOUS | Status: AC
  Administered 2019-12-30: 10:00:00 2 g via INTRAVENOUS

## 2019-12-30 MED ORDER — HEPARIN SOD (PORK) LOCK FLUSH 100 UNIT/ML IV SOLN
500.0000 [IU] | Freq: Once | INTRAVENOUS | Status: AC | PRN
  Administered 2019-12-30: 16:00:00 500 [IU]
  Filled 2019-12-30: qty 5

## 2019-12-30 MED ORDER — MAGNESIUM SULFATE 2 GM/50ML IV SOLN
INTRAVENOUS | Status: AC
  Filled 2019-12-30: qty 50

## 2019-12-30 MED ORDER — SODIUM CHLORIDE 0.9% FLUSH
10.0000 mL | INTRAVENOUS | Status: DC | PRN
  Administered 2019-12-30: 16:00:00 10 mL
  Filled 2019-12-30: qty 10

## 2019-12-30 NOTE — Patient Instructions (Signed)
[facility] Discharge Instructions for Patients Receiving Chemotherapy  Today you received the following chemotherapy agents: etoposide and cisplatin.  To help prevent nausea and vomiting after your treatment, we encourage you to take your nausea medication as directed.   If you develop nausea and vomiting that is not controlled by your nausea medication, call the clinic.   BELOW ARE SYMPTOMS THAT SHOULD BE REPORTED IMMEDIATELY:  *FEVER GREATER THAN 100.5 F  *CHILLS WITH OR WITHOUT FEVER  NAUSEA AND VOMITING THAT IS NOT CONTROLLED WITH YOUR NAUSEA MEDICATION  *UNUSUAL SHORTNESS OF BREATH  *UNUSUAL BRUISING OR BLEEDING  TENDERNESS IN MOUTH AND THROAT WITH OR WITHOUT PRESENCE OF ULCERS  *URINARY PROBLEMS  *BOWEL PROBLEMS  UNUSUAL RASH Items with * indicate a potential emergency and should be followed up as soon as possible.  Feel free to call the clinic should you have any questions or concerns. The clinic phone number is [phone].  Please show the CHEMO ALERT CARD at check-in to the Emergency Department and triage nurse.   

## 2020-01-04 ENCOUNTER — Inpatient Hospital Stay: Payer: 59

## 2020-01-04 ENCOUNTER — Other Ambulatory Visit: Payer: Self-pay

## 2020-01-04 VITALS — BP 117/70 | HR 99 | Temp 98.2°F | Resp 16 | Wt 166.0 lb

## 2020-01-04 DIAGNOSIS — C6292 Malignant neoplasm of left testis, unspecified whether descended or undescended: Secondary | ICD-10-CM

## 2020-01-04 DIAGNOSIS — Z95828 Presence of other vascular implants and grafts: Secondary | ICD-10-CM

## 2020-01-04 DIAGNOSIS — Z5111 Encounter for antineoplastic chemotherapy: Secondary | ICD-10-CM | POA: Diagnosis not present

## 2020-01-04 LAB — CMP (CANCER CENTER ONLY)
ALT: 19 U/L (ref 0–44)
AST: 16 U/L (ref 15–41)
Albumin: 3.9 g/dL (ref 3.5–5.0)
Alkaline Phosphatase: 39 U/L (ref 38–126)
Anion gap: 5 (ref 5–15)
BUN: 20 mg/dL (ref 6–20)
CO2: 25 mmol/L (ref 22–32)
Calcium: 9.4 mg/dL (ref 8.9–10.3)
Chloride: 104 mmol/L (ref 98–111)
Creatinine: 0.78 mg/dL (ref 0.61–1.24)
GFR, Estimated: >60 mL/min (ref >60)
Glucose, Bld: 98 mg/dL (ref 70–99)
Potassium: 4.3 mmol/L (ref 3.5–5.1)
Sodium: 134 mmol/L — ABNORMAL LOW (ref 135–145)
Total Bilirubin: 0.3 mg/dL (ref 0.3–1.2)
Total Protein: 6.9 g/dL (ref 6.5–8.1)

## 2020-01-04 LAB — CBC WITH DIFFERENTIAL (CANCER CENTER ONLY)
Abs Immature Granulocytes: 0.04 10*3/uL (ref 0.00–0.07)
Basophils Absolute: 0.1 10*3/uL (ref 0.0–0.1)
Basophils Relative: 1 %
Eosinophils Absolute: 0 10*3/uL (ref 0.0–0.5)
Eosinophils Relative: 0 %
HCT: 32.7 % — ABNORMAL LOW (ref 39.0–52.0)
Hemoglobin: 11.6 g/dL — ABNORMAL LOW (ref 13.0–17.0)
Immature Granulocytes: 1 %
Lymphocytes Relative: 24 %
Lymphs Abs: 1.3 10*3/uL (ref 0.7–4.0)
MCH: 30.2 pg (ref 26.0–34.0)
MCHC: 35.5 g/dL (ref 30.0–36.0)
MCV: 85.2 fL (ref 80.0–100.0)
Monocytes Absolute: 0.2 10*3/uL (ref 0.1–1.0)
Monocytes Relative: 3 %
Neutro Abs: 3.8 10*3/uL (ref 1.7–7.7)
Neutrophils Relative %: 71 %
Platelet Count: 235 10*3/uL (ref 150–400)
RBC: 3.84 MIL/uL — ABNORMAL LOW (ref 4.22–5.81)
RDW: 12.7 % (ref 11.5–15.5)
WBC Count: 5.4 10*3/uL (ref 4.0–10.5)
nRBC: 0 % (ref 0.0–0.2)

## 2020-01-04 MED ORDER — PROCHLORPERAZINE MALEATE 10 MG PO TABS
ORAL_TABLET | ORAL | Status: AC
  Filled 2020-01-04: qty 1

## 2020-01-04 MED ORDER — PROCHLORPERAZINE MALEATE 10 MG PO TABS
10.0000 mg | ORAL_TABLET | Freq: Once | ORAL | Status: AC
  Administered 2020-01-04: 11:00:00 10 mg via ORAL

## 2020-01-04 MED ORDER — SODIUM CHLORIDE 0.9 % IV SOLN
Freq: Once | INTRAVENOUS | Status: AC
  Filled 2020-01-04: qty 250

## 2020-01-04 MED ORDER — HEPARIN SOD (PORK) LOCK FLUSH 100 UNIT/ML IV SOLN
500.0000 [IU] | Freq: Once | INTRAVENOUS | Status: AC | PRN
  Administered 2020-01-04: 12:00:00 500 [IU]
  Filled 2020-01-04: qty 5

## 2020-01-04 MED ORDER — SODIUM CHLORIDE 0.9 % IV SOLN
30.0000 [IU] | Freq: Once | INTRAVENOUS | Status: AC
  Administered 2020-01-04: 11:00:00 30 [IU] via INTRAVENOUS
  Filled 2020-01-04: qty 10

## 2020-01-04 MED ORDER — SODIUM CHLORIDE 0.9% FLUSH
10.0000 mL | Freq: Once | INTRAVENOUS | Status: AC
  Administered 2020-01-04: 10:00:00 10 mL
  Filled 2020-01-04: qty 10

## 2020-01-04 MED ORDER — SODIUM CHLORIDE 0.9% FLUSH
10.0000 mL | INTRAVENOUS | Status: DC | PRN
  Administered 2020-01-04: 12:00:00 10 mL
  Filled 2020-01-04: qty 10

## 2020-01-04 NOTE — Progress Notes (Signed)
Per RN who spoke with Dr. Clelia Croft, will defer treatment with day 5 (originally scheduled 12/24) and will continue with day 9 bleomycin today as scheduled.   Fara Olden, PharmD, BCPS PGY-2 Hematology/Oncology Pharmacy Resident

## 2020-01-04 NOTE — Patient Instructions (Signed)
[facility] Discharge Instructions for Patients Receiving Chemotherapy  Today you received the following chemotherapy agents: Bleocin  To help prevent nausea and vomiting after your treatment, we encourage you to take your nausea medication as directed   If you develop nausea and vomiting that is not controlled by your nausea medication, call the clinic.   BELOW ARE SYMPTOMS THAT SHOULD BE REPORTED IMMEDIATELY:  *FEVER GREATER THAN 100.5 F  *CHILLS WITH OR WITHOUT FEVER  NAUSEA AND VOMITING THAT IS NOT CONTROLLED WITH YOUR NAUSEA MEDICATION  *UNUSUAL SHORTNESS OF BREATH  *UNUSUAL BRUISING OR BLEEDING  TENDERNESS IN MOUTH AND THROAT WITH OR WITHOUT PRESENCE OF ULCERS  *URINARY PROBLEMS  *BOWEL PROBLEMS  UNUSUAL RASH Items with * indicate a potential emergency and should be followed up as soon as possible.  Feel free to call the clinic should you have any questions or concerns. The clinic phone number is [phone].  Please show the CHEMO ALERT CARD at check-in to the Emergency Department and triage nurse.   

## 2020-01-11 ENCOUNTER — Other Ambulatory Visit: Payer: 59

## 2020-01-12 ENCOUNTER — Ambulatory Visit: Payer: 59

## 2020-01-12 ENCOUNTER — Inpatient Hospital Stay (HOSPITAL_BASED_OUTPATIENT_CLINIC_OR_DEPARTMENT_OTHER): Payer: 59 | Admitting: Oncology

## 2020-01-12 ENCOUNTER — Inpatient Hospital Stay: Payer: 59 | Attending: Oncology

## 2020-01-12 ENCOUNTER — Other Ambulatory Visit: Payer: 59

## 2020-01-12 ENCOUNTER — Inpatient Hospital Stay: Payer: 59

## 2020-01-12 ENCOUNTER — Other Ambulatory Visit: Payer: Self-pay

## 2020-01-12 VITALS — BP 144/82 | HR 96 | Temp 98.9°F | Resp 18 | Ht 66.0 in | Wt 167.6 lb

## 2020-01-12 DIAGNOSIS — C6292 Malignant neoplasm of left testis, unspecified whether descended or undescended: Secondary | ICD-10-CM | POA: Diagnosis present

## 2020-01-12 DIAGNOSIS — Z5111 Encounter for antineoplastic chemotherapy: Secondary | ICD-10-CM | POA: Diagnosis not present

## 2020-01-12 LAB — CBC WITH DIFFERENTIAL (CANCER CENTER ONLY)
Abs Immature Granulocytes: 0.01 10*3/uL (ref 0.00–0.07)
Basophils Absolute: 0 10*3/uL (ref 0.0–0.1)
Basophils Relative: 1 %
Eosinophils Absolute: 0 10*3/uL (ref 0.0–0.5)
Eosinophils Relative: 0 %
HCT: 32.3 % — ABNORMAL LOW (ref 39.0–52.0)
Hemoglobin: 11.5 g/dL — ABNORMAL LOW (ref 13.0–17.0)
Immature Granulocytes: 0 %
Lymphocytes Relative: 31 %
Lymphs Abs: 1.1 10*3/uL (ref 0.7–4.0)
MCH: 30.7 pg (ref 26.0–34.0)
MCHC: 35.6 g/dL (ref 30.0–36.0)
MCV: 86.4 fL (ref 80.0–100.0)
Monocytes Absolute: 0.5 10*3/uL (ref 0.1–1.0)
Monocytes Relative: 15 %
Neutro Abs: 1.8 10*3/uL (ref 1.7–7.7)
Neutrophils Relative %: 53 %
Platelet Count: 178 10*3/uL (ref 150–400)
RBC: 3.74 MIL/uL — ABNORMAL LOW (ref 4.22–5.81)
RDW: 13.8 % (ref 11.5–15.5)
WBC Count: 3.4 10*3/uL — ABNORMAL LOW (ref 4.0–10.5)
nRBC: 0 % (ref 0.0–0.2)

## 2020-01-12 LAB — CMP (CANCER CENTER ONLY)
ALT: 17 U/L (ref 0–44)
AST: 17 U/L (ref 15–41)
Albumin: 4 g/dL (ref 3.5–5.0)
Alkaline Phosphatase: 39 U/L (ref 38–126)
Anion gap: 6 (ref 5–15)
BUN: 14 mg/dL (ref 6–20)
CO2: 27 mmol/L (ref 22–32)
Calcium: 9.1 mg/dL (ref 8.9–10.3)
Chloride: 106 mmol/L (ref 98–111)
Creatinine: 0.85 mg/dL (ref 0.61–1.24)
GFR, Estimated: >60 mL/min (ref >60)
Glucose, Bld: 89 mg/dL (ref 70–99)
Potassium: 3.7 mmol/L (ref 3.5–5.1)
Sodium: 139 mmol/L (ref 135–145)
Total Bilirubin: 0.5 mg/dL (ref 0.3–1.2)
Total Protein: 6.9 g/dL (ref 6.5–8.1)

## 2020-01-12 MED ORDER — SODIUM CHLORIDE 0.9 % IV SOLN
Freq: Once | INTRAVENOUS | Status: AC
  Filled 2020-01-12: qty 250

## 2020-01-12 MED ORDER — SODIUM CHLORIDE 0.9 % IV SOLN
30.0000 [IU] | Freq: Once | INTRAVENOUS | Status: AC
  Administered 2020-01-12: 30 [IU] via INTRAVENOUS
  Filled 2020-01-12: qty 10

## 2020-01-12 MED ORDER — HEPARIN SOD (PORK) LOCK FLUSH 100 UNIT/ML IV SOLN
500.0000 [IU] | Freq: Once | INTRAVENOUS | Status: AC | PRN
  Administered 2020-01-12: 500 [IU]
  Filled 2020-01-12: qty 5

## 2020-01-12 MED ORDER — SODIUM CHLORIDE 0.9% FLUSH
10.0000 mL | INTRAVENOUS | Status: DC | PRN
  Administered 2020-01-12: 10 mL
  Filled 2020-01-12: qty 10

## 2020-01-12 MED ORDER — PROCHLORPERAZINE MALEATE 10 MG PO TABS
10.0000 mg | ORAL_TABLET | Freq: Once | ORAL | Status: AC
  Administered 2020-01-12: 10 mg via ORAL

## 2020-01-12 MED ORDER — MAGNESIUM SULFATE 2 GM/50ML IV SOLN: 2.0000 g | Freq: Once | INTRAVENOUS | Status: DC

## 2020-01-12 MED ORDER — PROCHLORPERAZINE MALEATE 10 MG PO TABS
ORAL_TABLET | ORAL | Status: AC
  Filled 2020-01-12: qty 1

## 2020-01-12 MED ORDER — POTASSIUM CHLORIDE IN NACL 20-0.9 MEQ/L-% IV SOLN: Freq: Once | INTRAVENOUS | Status: DC

## 2020-01-12 NOTE — Progress Notes (Signed)
Hematology and Oncology Follow Up Visit  Cory Powell [id number] 02-04-1990 30 y.o. 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  He was found to have T2N2, nonseminomatous tumor 20% embryonal and 80% seminoma.    Prior Therapy:  He is status post orchiectomy completed on October 19, 2019.  With the final pathology showed 20% embryonal and 80% seminoma and that T2 lesion.  Current therapy: BEP chemotherapy started on November 15, 2019.  He is here for day 16 of cycle 3.  Interim History: Cory Powell presents today for repeat evaluation.  Since last visit, he reports no major changes in his health.  He has tolerated chemotherapy without any major complaints.  He denies any nausea, vomiting or abdominal pain.  He denies any respiratory complaints.  Denies any worsening neuropathy or diarrhea.    Medications: Unchanged on review. Current Outpatient Medications  Medication Sig Dispense Refill  . lidocaine-prilocaine (EMLA) cream Apply 1 application topically as needed. 30 g 0  . prochlorperazine (COMPAZINE) 10 MG tablet Take 1 tablet (10 mg total) by mouth every 6 (six) hours as needed for nausea or vomiting. 30 tablet 0   No current facility-administered medications for this visit.   Facility-Administered Medications Ordered in Other Visits  Medication Dose Route Frequency Provider Last Rate Last Admin  . sodium chloride flush (NS) 0.9 % injection 10 mL  10 mL Intracatheter PRN Benjiman Core, MD   10 mL at 12/07/19 1541     Allergies: No Known Allergies    Physical Exam:  Blood pressure (!) 144/82, pulse 96, temperature 98.9 F (37.2 C), temperature source Tympanic, resp. rate 18, height 5\' 6"  (1.676 m), weight 167 lb 9.6 oz (76 kg), SpO2 98 %.    ECOG: 0    General appearance: Alert, awake without any distress. Head: Atraumatic without  abnormalities Oropharynx: Without any thrush or ulcers. Eyes: No scleral icterus. Lymph nodes: No lymphadenopathy noted in the cervical, supraclavicular, or axillary nodes Heart:regular rate and rhythm, without any murmurs or gallops.   Lung: Clear to auscultation without any rhonchi, wheezes or dullness to percussion. Abdomin: Soft, nontender without any shifting dullness or ascites. Musculoskeletal: No clubbing or cyanosis. Neurological: No motor or sensory deficits. Skin: No rashes or lesions.        Lab Results: Lab Results  Component Value Date   WBC 5.4 01/04/2020   HGB 11.6 (L) 01/04/2020   HCT 32.7 (L) 01/04/2020   MCV 85.2 01/04/2020   PLT 235 01/04/2020     Chemistry      Component Value Date/Time   NA 134 (L) 01/04/2020 0937   K 4.3 01/04/2020 0937   CL 104 01/04/2020 0937   CO2 25 01/04/2020 0937   BUN 20 01/04/2020 0937   CREATININE 0.78 01/04/2020 0937      Component Value Date/Time   CALCIUM 9.4 01/04/2020 0937   ALKPHOS 39 01/04/2020 0937   AST 16 01/04/2020 0937   ALT 19 01/04/2020 0937   BILITOT 0.3 01/04/2020 0937         Impression and Plan:   30 year old man with:  1.    Stage IIb, left testicular cancer diagnosed in October 2021.  He was found to have retroperitoneal mass with 20% embryonal component and 80% seminoma in his testicle.  He is completing 3 cycles of chemotherapy without any major complications.  Risks and benefits of proceeding with  bleomycin today were reviewed.  He has not experienced any complications at this time.  Upon completing cycle 3 today we will arrange for repeat imaging studies in the next 3 to 4 weeks and initiate surveillance follow-up after that.  Surgical resection of any residual tumor could be considered especially in the setting of a predominantly seminomatous tumor.   He is agreeable with this plan.  2.  IV access: Port-A-Cath will remain in place for the time being and will be removed she continues  to be in remission.  3.  Antiemetics: No residual nausea or vomiting reported at this time.  4.  Fertility issues: This was discussed in detail multiple occasions were reiterated at this time.  He was given the option of sperm banking previous chemotherapy.  5.  Renal surveillance: Creatinine remained stable on platinum based therapy.  6.  Pulmonary toxicity surveillance: No pulmonary toxicity noted with baseline and subsequent DLCO remained stable.  7.  Follow-up: In the next 3 to 4 weeks for repeat evaluation and imaging studies.  30  minutes were dedicated to this visit.  The time was spent on reviewing his disease status, future management options and addressing complications related to his cancer therapy eluding survivorship.    Zola Button, MD 1/5/202210:05 AM

## 2020-01-26 ENCOUNTER — Encounter: Payer: Self-pay | Admitting: Oncology

## 2020-02-09 ENCOUNTER — Inpatient Hospital Stay: Payer: 59

## 2020-02-09 ENCOUNTER — Inpatient Hospital Stay: Payer: 59 | Attending: Oncology

## 2020-02-09 ENCOUNTER — Other Ambulatory Visit: Payer: Self-pay

## 2020-02-09 ENCOUNTER — Ambulatory Visit (HOSPITAL_COMMUNITY)
Admission: RE | Admit: 2020-02-09 | Discharge: 2020-02-09 | Disposition: A | Discharge status: Home / Self Care | Payer: 59 | Source: Ambulatory Visit | Attending: Oncology | Admitting: Oncology

## 2020-02-09 DIAGNOSIS — Z95828 Presence of other vascular implants and grafts: Secondary | ICD-10-CM

## 2020-02-09 DIAGNOSIS — Z9079 Acquired absence of other genital organ(s): Secondary | ICD-10-CM | POA: Diagnosis not present

## 2020-02-09 DIAGNOSIS — C6292 Malignant neoplasm of left testis, unspecified whether descended or undescended: Secondary | ICD-10-CM | POA: Insufficient documentation

## 2020-02-09 LAB — CBC WITH DIFFERENTIAL (CANCER CENTER ONLY)
Abs Immature Granulocytes: 0.02 10*3/uL (ref 0.00–0.07)
Basophils Absolute: 0 10*3/uL (ref 0.0–0.1)
Basophils Relative: 1 %
Eosinophils Absolute: 0.1 10*3/uL (ref 0.0–0.5)
Eosinophils Relative: 2 %
HCT: 37.2 % — ABNORMAL LOW (ref 39.0–52.0)
Hemoglobin: 13 g/dL (ref 13.0–17.0)
Immature Granulocytes: 0 %
Lymphocytes Relative: 22 %
Lymphs Abs: 1.3 10*3/uL (ref 0.7–4.0)
MCH: 31.1 pg (ref 26.0–34.0)
MCHC: 34.9 g/dL (ref 30.0–36.0)
MCV: 89 fL (ref 80.0–100.0)
Monocytes Absolute: 0.5 10*3/uL (ref 0.1–1.0)
Monocytes Relative: 9 %
Neutro Abs: 4 10*3/uL (ref 1.7–7.7)
Neutrophils Relative %: 66 %
Platelet Count: 226 10*3/uL (ref 150–400)
RBC: 4.18 MIL/uL — ABNORMAL LOW (ref 4.22–5.81)
RDW: 12.2 % (ref 11.5–15.5)
WBC Count: 6 10*3/uL (ref 4.0–10.5)
nRBC: 0 % (ref 0.0–0.2)

## 2020-02-09 LAB — CMP (CANCER CENTER ONLY)
ALT: 40 U/L (ref 0–44)
AST: 28 U/L (ref 15–41)
Albumin: 4.4 g/dL (ref 3.5–5.0)
Alkaline Phosphatase: 40 U/L (ref 38–126)
Anion gap: 5 (ref 5–15)
BUN: 12 mg/dL (ref 6–20)
CO2: 29 mmol/L (ref 22–32)
Calcium: 9.2 mg/dL (ref 8.9–10.3)
Chloride: 106 mmol/L (ref 98–111)
Creatinine: 1.06 mg/dL (ref 0.61–1.24)
GFR, Estimated: >60 mL/min (ref >60)
Glucose, Bld: 103 mg/dL — ABNORMAL HIGH (ref 70–99)
Potassium: 3.8 mmol/L (ref 3.5–5.1)
Sodium: 140 mmol/L (ref 135–145)
Total Bilirubin: 0.7 mg/dL (ref 0.3–1.2)
Total Protein: 7.4 g/dL (ref 6.5–8.1)

## 2020-02-09 LAB — LACTATE DEHYDROGENASE: LDH: 197 U/L — ABNORMAL HIGH (ref 98–192)

## 2020-02-09 MED ORDER — SODIUM CHLORIDE 0.9% FLUSH
10.0000 mL | Freq: Once | INTRAVENOUS | Status: AC
  Administered 2020-02-09: 10 mL
  Filled 2020-02-09: qty 10

## 2020-02-09 MED ORDER — HEPARIN SOD (PORK) LOCK FLUSH 100 UNIT/ML IV SOLN
500.0000 [IU] | Freq: Once | INTRAVENOUS | Status: AC
  Administered 2020-02-09: 500 [IU] via INTRAVENOUS

## 2020-02-09 MED ORDER — HEPARIN SOD (PORK) LOCK FLUSH 100 UNIT/ML IV SOLN
INTRAVENOUS | Status: AC
  Filled 2020-02-09: qty 5

## 2020-02-09 MED ORDER — IOHEXOL 300 MG/ML  SOLN
100.0000 mL | Freq: Once | INTRAMUSCULAR | Status: AC | PRN
  Administered 2020-02-09: 100 mL via INTRAVENOUS

## 2020-02-09 NOTE — Patient Instructions (Signed)
Implanted Port Insertion, Care After This sheet gives you information about how to care for yourself after your procedure. Your health care provider may also give you more specific instructions. If you have problems or questions, contact your health care provider. What can I expect after the procedure? After the procedure, it is common to have:  Discomfort at the port insertion site.  Bruising on the skin over the port. This should improve over 3-4 days. Follow these instructions at home: Port care  After your port is placed, you will get a manufacturer's information card. The card has information about your port. Keep this card with you at all times.  Take care of the port as told by your health care provider. Ask your health care provider if you or a family member can get training for taking care of the port at home. A home health care nurse may also take care of the port.  Make sure to remember what type of port you have. Incision care  Follow instructions from your health care provider about how to take care of your port insertion site. Make sure you: ? Wash your hands with soap and water before and after you change your bandage (dressing). If soap and water are not available, use hand sanitizer. ? Change your dressing as told by your health care provider. ? Leave stitches (sutures), skin glue, or adhesive strips in place. These skin closures may need to stay in place for 2 weeks or longer. If adhesive strip edges start to loosen and curl up, you may trim the loose edges. Do not remove adhesive strips completely unless your health care provider tells you to do that.  Check your port insertion site every day for signs of infection. Check for: ? Redness, swelling, or pain. ? Fluid or blood. ? Warmth. ? Pus or a bad smell.      Activity  Return to your normal activities as told by your health care provider. Ask your health care provider what activities are safe for you.  Do not  lift anything that is heavier than 10 lb (4.5 kg), or the limit that you are told, until your health care provider says that it is safe. General instructions  Take over-the-counter and prescription medicines only as told by your health care provider.  Do not take baths, swim, or use a hot tub until your health care provider approves. Ask your health care provider if you may take showers. You may only be allowed to take sponge baths.  Do not drive for 24 hours if you were given a sedative during your procedure.  Wear a medical alert bracelet in case of an emergency. This will tell any health care providers that you have a port.  Keep all follow-up visits as told by your health care provider. This is important. Contact a health care provider if:  You cannot flush your port with saline as directed, or you cannot draw blood from the port.  You have a fever or chills.  You have redness, swelling, or pain around your port insertion site.  You have fluid or blood coming from your port insertion site.  Your port insertion site feels warm to the touch.  You have pus or a bad smell coming from the port insertion site. Get help right away if:  You have chest pain or shortness of breath.  You have bleeding from your port that you cannot control. Summary  Take care of the port as told by your   health care provider. Keep the manufacturer's information card with you at all times.  Change your dressing as told by your health care provider.  Contact a health care provider if you have a fever or chills or if you have redness, swelling, or pain around your port insertion site.  Keep all follow-up visits as told by your health care provider. This information is not intended to replace advice given to you by your health care provider. Make sure you discuss any questions you have with your health care provider. Document Revised: 07/22/2017 Document Reviewed: 07/22/2017 Elsevier Patient Education   2021 Elsevier Inc.  

## 2020-02-10 LAB — AFP TUMOR MARKER: AFP, Serum, Tumor Marker: 4.3 ng/mL (ref 0.0–8.3)

## 2020-02-10 LAB — BETA HCG QUANT (REF LAB): hCG Quant: <1 m[IU]/mL (ref 0–3)

## 2020-02-15 ENCOUNTER — Other Ambulatory Visit: Payer: Self-pay

## 2020-02-15 ENCOUNTER — Inpatient Hospital Stay (HOSPITAL_BASED_OUTPATIENT_CLINIC_OR_DEPARTMENT_OTHER): Payer: 59 | Admitting: Oncology

## 2020-02-15 VITALS — BP 124/76 | HR 89 | Temp 97.8°F | Resp 18 | Ht 66.0 in | Wt 174.9 lb

## 2020-02-15 DIAGNOSIS — C6292 Malignant neoplasm of left testis, unspecified whether descended or undescended: Secondary | ICD-10-CM

## 2020-02-15 DIAGNOSIS — Z95828 Presence of other vascular implants and grafts: Secondary | ICD-10-CM | POA: Diagnosis not present

## 2020-02-15 NOTE — Progress Notes (Signed)
Hematology and Oncology Follow Up Visit  Cory Powell [id number] Jul 18, 1990 30 y.o. 02/15/2020 3:25 PM Cory Powell, DOMatthews, [place], DO   Principle Diagnosis: 30 year old man with testicular cancer diagnosed with stage IIb nonseminomatous tumor with 20% embryonal and 80% seminoma.  He was found to have a left testicular tumor stage T2N2 disease.   Prior Therapy:  He is status post orchiectomy completed on October 19, 2019.  With the final pathology showed 20% embryonal and 80% seminoma and that T2 lesion.  BEP chemotherapy started on November 15, 2019.  He completed 3 cycles of therapy on January 12, 2020.  Current therapy: Active surveillance.  Interim History: Cory Powell is here for repeat follow-up.  Since the last visit, he had completed the chemotherapy without any major complaints.  He denies any nausea, vomiting or worsening neuropathy.  He denies any weight loss or appetite changes.  He is eating more and has gained weight.    Medications: Updated on review. Current Outpatient Medications  Medication Sig Dispense Refill  . lidocaine-prilocaine (EMLA) cream Apply 1 application topically as needed. 30 g 0  . prochlorperazine (COMPAZINE) 10 MG tablet Take 1 tablet (10 mg total) by mouth every 6 (six) hours as needed for nausea or vomiting. 30 tablet 0   No current facility-administered medications for this visit.   Facility-Administered Medications Ordered in Other Visits  Medication Dose Route Frequency Provider Last Rate Last Admin  . sodium chloride flush (NS) 0.9 % injection 10 mL  10 mL Intracatheter PRN Cory Portela, MD   10 mL at 12/07/19 1541     Allergies: No Known Allergies    Physical Exam:   Blood pressure 124/76, pulse 89, temperature 97.8 F (36.6 C), temperature source Tympanic, resp. rate 18, height 5\' 6"  (1.676 m), weight 174 lb 14.4 oz (79.3 kg), SpO2 100 %.    ECOG: 0    General appearance: Comfortable appearing without any  discomfort Head: Normocephalic without any trauma Oropharynx: Mucous membranes are moist and pink without any thrush or ulcers. Eyes: Pupils are equal and round reactive to light. Lymph nodes: No cervical, supraclavicular, inguinal or axillary lymphadenopathy.   Heart:regular rate and rhythm.  S1 and S2 without leg edema. Lung: Clear without any rhonchi or wheezes.  No dullness to percussion. Abdomin: Soft, nontender, nondistended with good bowel sounds.  No hepatosplenomegaly. Musculoskeletal: No joint deformity or effusion.  Full range of motion noted. Neurological: No deficits noted on motor, sensory and deep tendon reflex exam. Skin: No petechial rash or dryness.  Appeared moist.          Lab Results: Lab Results  Component Value Date   WBC 6.0 02/09/2020   HGB 13.0 02/09/2020   HCT 37.2 (L) 02/09/2020   MCV 89.0 02/09/2020   PLT 226 02/09/2020     Chemistry      Component Value Date/Time   NA 140 02/09/2020 1127   K 3.8 02/09/2020 1127   CL 106 02/09/2020 1127   CO2 29 02/09/2020 1127   BUN 12 02/09/2020 1127   CREATININE 1.06 02/09/2020 1127      Component Value Date/Time   CALCIUM 9.2 02/09/2020 1127   ALKPHOS 40 02/09/2020 1127   AST 28 02/09/2020 1127   ALT 40 02/09/2020 1127   BILITOT 0.7 02/09/2020 1127      Results for Cory Powell, Cory M "[name]" (MRN [id number]) as of 02/15/2020 15:27  Ref. Range 02/09/2020 11:27  AFP, Serum, Tumor Marker Latest Ref Range: 0.0 -  8.3 ng/mL 4.3  hCG Quant Latest Ref Range: 0 - 3 mIU/mL <1   IMPRESSION: 1. Substantial reduction in size of the left periaortic lymph nodes, currently 0.8 cm in short axis. No new or enlarging pathologic adenopathy identified. 2. Indistinct mild airspace opacities along the lung bases are new compared to 10/26/2019, and likely primarily from atelectasis with atypical infectious process a less likely differential diagnostic consideration given the sparing of the rest of the lungs. 3. Small type  1 hiatal hernia.    Impression and Plan:   30 year old man with:  1.    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  He was found to have stage III disease with retroperitoneal adenopathy and 20% embryonal component and 80% seminoma.   The natural course of his disease was updated at this time and management options for the future were discussed.  CT scan obtained on February 09, 2020 was discussed and showed no evidence of any residual disease.  At this time I recommended continued active surveillance with repeat physical examination laboratory testing every 50-month for the first year and then every 6 months for year 2.  Imaging studies will be repeated every 6 months for the first 3 years and annually after that.  He is agreeable at this time.  Salvage therapy will only be used if he developed relapsed disease.  No indication for any surgical resection at this time without any residual masses.  2. Port-A-Cath management: Risks and benefits of Port-A-Cath removal were discussed at this time.  After discussion he opted to keep it for the time being and potentially remove it after the next CT scan.   3.  Pulmonary toxicity surveillance: No evidence of any residual complications at this time.  CT scan did show some evidence of atelectasis and atypical scarring which will be monitored on subsequent imaging studies.  Pulmonary function tests at baseline and after bleomycin has been stable.  4.  Follow-up: He will return in 3 months for physical examination laboratory testing in 6 months for repeat imaging studies.  30  minutes were spent on this encounter.  The time was dedicated to reviewing imaging studies, laboratory data, disease status update and plan of care discussion.    Zola Button, MD 2/8/20223:25 PM

## 2020-03-13 ENCOUNTER — Encounter: Payer: Self-pay | Admitting: Oncology

## 2020-03-29 ENCOUNTER — Other Ambulatory Visit: Payer: Self-pay

## 2020-03-29 ENCOUNTER — Inpatient Hospital Stay: Payer: BC Managed Care – PPO | Attending: Oncology

## 2020-03-29 DIAGNOSIS — Z452 Encounter for adjustment and management of vascular access device: Secondary | ICD-10-CM | POA: Insufficient documentation

## 2020-03-29 DIAGNOSIS — Z95828 Presence of other vascular implants and grafts: Secondary | ICD-10-CM

## 2020-03-29 DIAGNOSIS — C6292 Malignant neoplasm of left testis, unspecified whether descended or undescended: Secondary | ICD-10-CM | POA: Diagnosis not present

## 2020-03-29 MED ORDER — HEPARIN SOD (PORK) LOCK FLUSH 100 UNIT/ML IV SOLN
500.0000 [IU] | Freq: Once | INTRAVENOUS | Status: AC
  Administered 2020-03-29: 500 [IU]
  Filled 2020-03-29: qty 5

## 2020-03-29 MED ORDER — SODIUM CHLORIDE 0.9% FLUSH
10.0000 mL | Freq: Once | INTRAVENOUS | Status: AC
  Administered 2020-03-29: 10 mL
  Filled 2020-03-29: qty 10

## 2020-03-29 NOTE — Patient Instructions (Signed)
Implanted Port Insertion, Care After This sheet gives you information about how to care for yourself after your procedure. Your health care provider may also give you more specific instructions. If you have problems or questions, contact your health care provider. What can I expect after the procedure? After the procedure, it is common to have:  Discomfort at the port insertion site.  Bruising on the skin over the port. This should improve over 3-4 days. Follow these instructions at home: Port care  After your port is placed, you will get a manufacturer's information card. The card has information about your port. Keep this card with you at all times.  Take care of the port as told by your health care provider. Ask your health care provider if you or a family member can get training for taking care of the port at home. A home health care nurse may also take care of the port.  Make sure to remember what type of port you have. Incision care  Follow instructions from your health care provider about how to take care of your port insertion site. Make sure you: ? Wash your hands with soap and water before and after you change your bandage (dressing). If soap and water are not available, use hand sanitizer. ? Change your dressing as told by your health care provider. ? Leave stitches (sutures), skin glue, or adhesive strips in place. These skin closures may need to stay in place for 2 weeks or longer. If adhesive strip edges start to loosen and curl up, you may trim the loose edges. Do not remove adhesive strips completely unless your health care provider tells you to do that.  Check your port insertion site every day for signs of infection. Check for: ? Redness, swelling, or pain. ? Fluid or blood. ? Warmth. ? Pus or a bad smell.      Activity  Return to your normal activities as told by your health care provider. Ask your health care provider what activities are safe for you.  Do not  lift anything that is heavier than 10 lb (4.5 kg), or the limit that you are told, until your health care provider says that it is safe. General instructions  Take over-the-counter and prescription medicines only as told by your health care provider.  Do not take baths, swim, or use a hot tub until your health care provider approves. Ask your health care provider if you may take showers. You may only be allowed to take sponge baths.  Do not drive for 24 hours if you were given a sedative during your procedure.  Wear a medical alert bracelet in case of an emergency. This will tell any health care providers that you have a port.  Keep all follow-up visits as told by your health care provider. This is important. Contact a health care provider if:  You cannot flush your port with saline as directed, or you cannot draw blood from the port.  You have a fever or chills.  You have redness, swelling, or pain around your port insertion site.  You have fluid or blood coming from your port insertion site.  Your port insertion site feels warm to the touch.  You have pus or a bad smell coming from the port insertion site. Get help right away if:  You have chest pain or shortness of breath.  You have bleeding from your port that you cannot control. Summary  Take care of the port as told by your   health care provider. Keep the manufacturer's information card with you at all times.  Change your dressing as told by your health care provider.  Contact a health care provider if you have a fever or chills or if you have redness, swelling, or pain around your port insertion site.  Keep all follow-up visits as told by your health care provider. This information is not intended to replace advice given to you by your health care provider. Make sure you discuss any questions you have with your health care provider. Document Revised: 07/22/2017 Document Reviewed: 07/22/2017 Elsevier Patient Education   2021 Elsevier Inc.  

## 2020-05-11 ENCOUNTER — Telehealth: Payer: Self-pay | Admitting: Oncology

## 2020-05-11 NOTE — Telephone Encounter (Signed)
R/s appt per 5/5 sch msg. Pt aware.  

## 2020-05-16 ENCOUNTER — Other Ambulatory Visit: Payer: 59

## 2020-05-16 ENCOUNTER — Other Ambulatory Visit: Payer: Self-pay

## 2020-05-16 ENCOUNTER — Inpatient Hospital Stay: Payer: BC Managed Care – PPO | Attending: Oncology

## 2020-05-16 DIAGNOSIS — Z95828 Presence of other vascular implants and grafts: Secondary | ICD-10-CM

## 2020-05-16 DIAGNOSIS — C6292 Malignant neoplasm of left testis, unspecified whether descended or undescended: Secondary | ICD-10-CM | POA: Diagnosis present

## 2020-05-16 DIAGNOSIS — Z452 Encounter for adjustment and management of vascular access device: Secondary | ICD-10-CM | POA: Insufficient documentation

## 2020-05-16 LAB — CBC WITH DIFFERENTIAL (CANCER CENTER ONLY)
Abs Immature Granulocytes: 0.01 10*3/uL (ref 0.00–0.07)
Basophils Absolute: 0 10*3/uL (ref 0.0–0.1)
Basophils Relative: 0 %
Eosinophils Absolute: 0.1 10*3/uL (ref 0.0–0.5)
Eosinophils Relative: 2 %
HCT: 38.1 % — ABNORMAL LOW (ref 39.0–52.0)
Hemoglobin: 13.9 g/dL (ref 13.0–17.0)
Immature Granulocytes: 0 %
Lymphocytes Relative: 24 %
Lymphs Abs: 1.1 10*3/uL (ref 0.7–4.0)
MCH: 30.8 pg (ref 26.0–34.0)
MCHC: 36.5 g/dL — ABNORMAL HIGH (ref 30.0–36.0)
MCV: 84.3 fL (ref 80.0–100.0)
Monocytes Absolute: 0.4 10*3/uL (ref 0.1–1.0)
Monocytes Relative: 9 %
Neutro Abs: 3.1 10*3/uL (ref 1.7–7.7)
Neutrophils Relative %: 65 %
Platelet Count: 221 10*3/uL (ref 150–400)
RBC: 4.52 MIL/uL (ref 4.22–5.81)
RDW: 11.7 % (ref 11.5–15.5)
WBC Count: 4.7 10*3/uL (ref 4.0–10.5)
nRBC: 0 % (ref 0.0–0.2)

## 2020-05-16 LAB — CMP (CANCER CENTER ONLY)
ALT: 48 U/L — ABNORMAL HIGH (ref 0–44)
AST: 33 U/L (ref 15–41)
Albumin: 4.5 g/dL (ref 3.5–5.0)
Alkaline Phosphatase: 42 U/L (ref 38–126)
Anion gap: 8 (ref 5–15)
BUN: 16 mg/dL (ref 6–20)
CO2: 26 mmol/L (ref 22–32)
Calcium: 9.4 mg/dL (ref 8.9–10.3)
Chloride: 106 mmol/L (ref 98–111)
Creatinine: 1.11 mg/dL (ref 0.61–1.24)
GFR, Estimated: >60 mL/min (ref >60)
Glucose, Bld: 98 mg/dL (ref 70–99)
Potassium: 3.9 mmol/L (ref 3.5–5.1)
Sodium: 140 mmol/L (ref 135–145)
Total Bilirubin: 0.7 mg/dL (ref 0.3–1.2)
Total Protein: 7.4 g/dL (ref 6.5–8.1)

## 2020-05-16 LAB — LACTATE DEHYDROGENASE: LDH: 242 U/L — ABNORMAL HIGH (ref 98–192)

## 2020-05-16 MED ORDER — SODIUM CHLORIDE 0.9% FLUSH
10.0000 mL | Freq: Once | INTRAVENOUS | Status: AC
  Administered 2020-05-16: 10 mL
  Filled 2020-05-16: qty 10

## 2020-05-16 MED ORDER — HEPARIN SOD (PORK) LOCK FLUSH 100 UNIT/ML IV SOLN
250.0000 [IU] | Freq: Once | INTRAVENOUS | Status: AC
  Administered 2020-05-16: 500 [IU]
  Filled 2020-05-16: qty 5

## 2020-05-16 NOTE — Patient Instructions (Signed)

## 2020-05-17 LAB — AFP TUMOR MARKER: AFP, Serum, Tumor Marker: 5.1 ng/mL (ref 0.0–6.9)

## 2020-05-17 LAB — BETA HCG QUANT (REF LAB): hCG Quant: <1 m[IU]/mL (ref 0–3)

## 2020-05-22 ENCOUNTER — Telehealth: Payer: Self-pay | Admitting: Oncology

## 2020-05-22 NOTE — Telephone Encounter (Signed)
R/s appt per 5/16 sch msg. Pt aware.  

## 2020-05-23 ENCOUNTER — Ambulatory Visit: Payer: 59 | Admitting: Oncology

## 2020-06-20 ENCOUNTER — Encounter: Payer: Self-pay | Admitting: Oncology

## 2020-06-20 ENCOUNTER — Telehealth: Payer: Self-pay | Admitting: *Deleted

## 2020-06-20 ENCOUNTER — Telehealth: Payer: Self-pay | Admitting: Oncology

## 2020-06-20 NOTE — Telephone Encounter (Signed)
Scheduled appointment per 06/14 sch msg. Patient is aware. 

## 2020-06-20 NOTE — Telephone Encounter (Signed)
Received message from [place] has new insurance, has not received his card yet. Member ID: [id number]; Group number: [id number] Has an appt at [facility] on 06/28/20

## 2020-06-24 ENCOUNTER — Encounter: Payer: Self-pay | Admitting: Oncology

## 2020-06-28 ENCOUNTER — Encounter: Payer: Self-pay | Admitting: Oncology

## 2020-06-28 ENCOUNTER — Inpatient Hospital Stay: Payer: BLUE CROSS/BLUE SHIELD

## 2020-06-28 ENCOUNTER — Inpatient Hospital Stay: Payer: BLUE CROSS/BLUE SHIELD | Attending: Oncology | Admitting: Oncology

## 2020-06-28 ENCOUNTER — Other Ambulatory Visit: Payer: Self-pay

## 2020-06-28 VITALS — BP 122/78 | HR 74 | Temp 97.0°F | Resp 20 | Wt 168.8 lb

## 2020-06-28 DIAGNOSIS — C6292 Malignant neoplasm of left testis, unspecified whether descended or undescended: Secondary | ICD-10-CM

## 2020-06-28 DIAGNOSIS — Z95828 Presence of other vascular implants and grafts: Secondary | ICD-10-CM

## 2020-06-28 DIAGNOSIS — Z8547 Personal history of malignant neoplasm of testis: Secondary | ICD-10-CM | POA: Insufficient documentation

## 2020-06-28 LAB — CMP (CANCER CENTER ONLY)
ALT: 26 U/L (ref 0–44)
AST: 27 U/L (ref 15–41)
Albumin: 4.1 g/dL (ref 3.5–5.0)
Alkaline Phosphatase: 40 U/L (ref 38–126)
Anion gap: 10 (ref 5–15)
BUN: 19 mg/dL (ref 6–20)
CO2: 25 mmol/L (ref 22–32)
Calcium: 9.3 mg/dL (ref 8.9–10.3)
Chloride: 105 mmol/L (ref 98–111)
Creatinine: 1.13 mg/dL (ref 0.61–1.24)
GFR, Estimated: >60 mL/min (ref >60)
Glucose, Bld: 99 mg/dL (ref 70–99)
Potassium: 3.9 mmol/L (ref 3.5–5.1)
Sodium: 140 mmol/L (ref 135–145)
Total Bilirubin: 1.1 mg/dL (ref 0.3–1.2)
Total Protein: 7.3 g/dL (ref 6.5–8.1)

## 2020-06-28 LAB — CBC WITH DIFFERENTIAL (CANCER CENTER ONLY)
Abs Immature Granulocytes: 0.02 10*3/uL (ref 0.00–0.07)
Basophils Absolute: 0 10*3/uL (ref 0.0–0.1)
Basophils Relative: 0 %
Eosinophils Absolute: 0.2 10*3/uL (ref 0.0–0.5)
Eosinophils Relative: 3 %
HCT: 37.1 % — ABNORMAL LOW (ref 39.0–52.0)
Hemoglobin: 13.3 g/dL (ref 13.0–17.0)
Immature Granulocytes: 0 %
Lymphocytes Relative: 23 %
Lymphs Abs: 1.3 10*3/uL (ref 0.7–4.0)
MCH: 30.4 pg (ref 26.0–34.0)
MCHC: 35.8 g/dL (ref 30.0–36.0)
MCV: 84.9 fL (ref 80.0–100.0)
Monocytes Absolute: 0.5 10*3/uL (ref 0.1–1.0)
Monocytes Relative: 8 %
Neutro Abs: 3.7 10*3/uL (ref 1.7–7.7)
Neutrophils Relative %: 66 %
Platelet Count: 219 10*3/uL (ref 150–400)
RBC: 4.37 MIL/uL (ref 4.22–5.81)
RDW: 12 % (ref 11.5–15.5)
WBC Count: 5.7 10*3/uL (ref 4.0–10.5)
nRBC: 0 % (ref 0.0–0.2)

## 2020-06-28 LAB — LACTATE DEHYDROGENASE: LDH: 222 U/L — ABNORMAL HIGH (ref 98–192)

## 2020-06-28 MED ORDER — SODIUM CHLORIDE 0.9% FLUSH
10.0000 mL | Freq: Once | INTRAVENOUS | Status: AC
Start: 2020-06-28 — End: 2020-06-28
  Administered 2020-06-28: 09:00:00 10 mL
  Filled 2020-06-28: qty 10

## 2020-06-28 MED ORDER — HEPARIN SOD (PORK) LOCK FLUSH 100 UNIT/ML IV SOLN
500.0000 [IU] | Freq: Once | INTRAVENOUS | Status: AC
  Administered 2020-06-28: 09:00:00 500 [IU]
  Filled 2020-06-28: qty 5

## 2020-06-28 NOTE — Progress Notes (Signed)
Hematology and Oncology Follow Up Visit  Cory Powell [id number] 05/11/1990 30 y.o. 06/28/2020 8:37 AM Cory Powell, DOMatthews, [place], DO   Principle Diagnosis: 30 year old man T2N2 left testicular nonseminomatous tumor diagnosed in October 2021.  He was found to have tumor with 20% embryonal and 80% seminoma.     Prior Therapy:  He is status post orchiectomy completed on October 19, 2019.  With the final pathology showed 20% embryonal and 80% seminoma and that T2 lesion.  BEP chemotherapy started on November 15, 2019.  He completed 3 cycles of therapy on January 12, 2020.  Current therapy: Active surveillance.  Interim History: Mr. Powell returns today for repeat evaluation.  Since the last visit, he reports no major changes in his health.  He continues to report no delayed complications related to chemotherapy.  He denies any nausea vomiting or abdominal pain.  He denies shortness of breath or difficulty breathing.  His performance status quality of life remain excellent.  He does not take any medication at this time.    Medications: Unchanged on review, no longer taking any. Current Outpatient Medications  Medication Sig Dispense Refill   lidocaine-prilocaine (EMLA) cream Apply 1 application topically as needed. 30 g 0   prochlorperazine (COMPAZINE) 10 MG tablet Take 1 tablet (10 mg total) by mouth every 6 (six) hours as needed for nausea or vomiting. 30 tablet 0   No current facility-administered medications for this visit.     Allergies: No Known Allergies    Physical Exam:    Blood pressure 122/78, pulse 74, temperature (!) 97 F (36.1 C), temperature source Tympanic, resp. rate 20, weight 168 lb 12.8 oz (76.6 kg), SpO2 99 %.    ECOG: 0   General appearance: Alert, awake without any distress. Head: Atraumatic without abnormalities Oropharynx: Without any thrush or ulcers. Eyes: No scleral icterus. Lymph nodes: No lymphadenopathy noted in the cervical,  supraclavicular, or axillary nodes Heart:regular rate and rhythm, without any murmurs or gallops.   Lung: Clear to auscultation without any rhonchi, wheezes or dullness to percussion. Abdomin: Soft, nontender without any shifting dullness or ascites. Musculoskeletal: No clubbing or cyanosis. Neurological: No motor or sensory deficits. Skin: No rashes or lesions.          Lab Results: Lab Results  Component Value Date   WBC 4.7 05/16/2020   HGB 13.9 05/16/2020   HCT 38.1 (L) 05/16/2020   MCV 84.3 05/16/2020   PLT 221 05/16/2020     Chemistry      Component Value Date/Time   NA 140 05/16/2020 0958   K 3.9 05/16/2020 0958   CL 106 05/16/2020 0958   CO2 26 05/16/2020 0958   BUN 16 05/16/2020 0958   CREATININE 1.11 05/16/2020 0958      Component Value Date/Time   CALCIUM 9.4 05/16/2020 0958   ALKPHOS 42 05/16/2020 0958   AST 33 05/16/2020 0958   ALT 48 (H) 05/16/2020 0958   BILITOT 0.7 05/16/2020 0958      Results for Powell, Cory M "Cory" (MRN [id number]) as of 06/28/2020 08:39  Ref. Range 05/16/2020 09:58  Glucose Latest Ref Range: 70 - 99 mg/dL 98  hCG Quant Latest Ref Range: 0 - 3 mIU/mL <1  AFP, Serum, Tumor Marker Latest Ref Range: 0.0 - 6.9 ng/mL 5.1      Impression and Plan:   30 year old man with:   1.    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  He was found to have  T2N2 tumor with retroperitoneal adenopathy and 20% embryonal component and 80% seminoma.    The natural course of his disease was reviewed at this time.  Treatment options were discussed at this time.  He continues to be on active surveillance without any evidence of relapsed disease at this time.  Tumor markers from May 2022 showed no evidence of disease.  The plan is to repeat imaging studies and now August 2022 and every 8months for another year and annually after that.  He is agreeable to proceed at this time.   2. Port-A-Cath management: Port-A-Cath will  be flushed periodically and removed after the next scan.   3.  Pulmonary toxicity surveillance: No clinical signs or symptoms of any pulmonary toxicity.   4.  Follow-up: He will return in August 2022 for repeat imaging studies.     30  minutes were dedicated to this visit.  The time was spent on reviewing laboratory data, disease status update, treatment choices and future plan of care discussion.     Zola Button, MD 6/22/20228:37 AM

## 2020-06-29 LAB — BETA HCG QUANT (REF LAB): hCG Quant: <1 m[IU]/mL (ref 0–3)

## 2020-06-29 LAB — AFP TUMOR MARKER: AFP, Serum, Tumor Marker: 4.8 ng/mL (ref 0.0–6.9)

## 2020-08-29 ENCOUNTER — Inpatient Hospital Stay: Payer: BLUE CROSS/BLUE SHIELD | Attending: Oncology

## 2020-08-29 ENCOUNTER — Other Ambulatory Visit: Payer: Self-pay

## 2020-08-29 DIAGNOSIS — Z8547 Personal history of malignant neoplasm of testis: Secondary | ICD-10-CM | POA: Diagnosis present

## 2020-08-29 DIAGNOSIS — C6292 Malignant neoplasm of left testis, unspecified whether descended or undescended: Secondary | ICD-10-CM

## 2020-08-29 DIAGNOSIS — Z95828 Presence of other vascular implants and grafts: Secondary | ICD-10-CM

## 2020-08-29 LAB — CMP (CANCER CENTER ONLY)
ALT: 28 U/L (ref 0–44)
AST: 31 U/L (ref 15–41)
Albumin: 4.4 g/dL (ref 3.5–5.0)
Alkaline Phosphatase: 44 U/L (ref 38–126)
Anion gap: 9 (ref 5–15)
BUN: 26 mg/dL — ABNORMAL HIGH (ref 6–20)
CO2: 27 mmol/L (ref 22–32)
Calcium: 9.5 mg/dL (ref 8.9–10.3)
Chloride: 105 mmol/L (ref 98–111)
Creatinine: 1.32 mg/dL — ABNORMAL HIGH (ref 0.61–1.24)
GFR, Estimated: >60 mL/min (ref >60)
Glucose, Bld: 110 mg/dL — ABNORMAL HIGH (ref 70–99)
Potassium: 3.9 mmol/L (ref 3.5–5.1)
Sodium: 141 mmol/L (ref 135–145)
Total Bilirubin: 0.6 mg/dL (ref 0.3–1.2)
Total Protein: 7.7 g/dL (ref 6.5–8.1)

## 2020-08-29 LAB — CBC WITH DIFFERENTIAL (CANCER CENTER ONLY)
Abs Immature Granulocytes: 0 10*3/uL (ref 0.00–0.07)
Basophils Absolute: 0 10*3/uL (ref 0.0–0.1)
Basophils Relative: 1 %
Eosinophils Absolute: 0.1 10*3/uL (ref 0.0–0.5)
Eosinophils Relative: 2 %
HCT: 38.2 % — ABNORMAL LOW (ref 39.0–52.0)
Hemoglobin: 13.9 g/dL (ref 13.0–17.0)
Immature Granulocytes: 0 %
Lymphocytes Relative: 21 %
Lymphs Abs: 1.1 10*3/uL (ref 0.7–4.0)
MCH: 31 pg (ref 26.0–34.0)
MCHC: 36.4 g/dL — ABNORMAL HIGH (ref 30.0–36.0)
MCV: 85.1 fL (ref 80.0–100.0)
Monocytes Absolute: 0.5 10*3/uL (ref 0.1–1.0)
Monocytes Relative: 8 %
Neutro Abs: 3.8 10*3/uL (ref 1.7–7.7)
Neutrophils Relative %: 68 %
Platelet Count: 219 10*3/uL (ref 150–400)
RBC: 4.49 MIL/uL (ref 4.22–5.81)
RDW: 11.3 % — ABNORMAL LOW (ref 11.5–15.5)
WBC Count: 5.5 10*3/uL (ref 4.0–10.5)
nRBC: 0 % (ref 0.0–0.2)

## 2020-08-29 LAB — LACTATE DEHYDROGENASE: LDH: 217 U/L — ABNORMAL HIGH (ref 98–192)

## 2020-08-29 MED ORDER — HEPARIN SOD (PORK) LOCK FLUSH 100 UNIT/ML IV SOLN
500.0000 [IU] | Freq: Once | INTRAVENOUS | Status: AC
  Administered 2020-08-29: 500 [IU]

## 2020-08-29 MED ORDER — SODIUM CHLORIDE 0.9% FLUSH
10.0000 mL | Freq: Once | INTRAVENOUS | Status: AC
Start: 2020-08-29 — End: 2020-08-29
  Administered 2020-08-29: 10 mL

## 2020-08-30 LAB — BETA HCG QUANT (REF LAB): hCG Quant: <1 m[IU]/mL (ref 0–3)

## 2020-08-30 LAB — AFP TUMOR MARKER: AFP, Serum, Tumor Marker: 4.9 ng/mL (ref 0.0–6.9)

## 2020-09-01 ENCOUNTER — Telehealth: Payer: Self-pay | Admitting: Oncology

## 2020-09-01 NOTE — Telephone Encounter (Signed)
Scheduled per 08/26 los, patient has been called and voicemail was left.

## 2020-09-04 ENCOUNTER — Ambulatory Visit (HOSPITAL_COMMUNITY)
Admission: RE | Admit: 2020-09-04 | Discharge: 2020-09-04 | Disposition: A | Discharge status: Home / Self Care | Payer: BLUE CROSS/BLUE SHIELD | Source: Ambulatory Visit | Attending: Oncology | Admitting: Oncology

## 2020-09-04 ENCOUNTER — Other Ambulatory Visit: Payer: Self-pay

## 2020-09-04 DIAGNOSIS — C6292 Malignant neoplasm of left testis, unspecified whether descended or undescended: Secondary | ICD-10-CM | POA: Diagnosis present

## 2020-09-04 MED ORDER — IOHEXOL 350 MG/ML SOLN
80.0000 mL | Freq: Once | INTRAVENOUS | Status: AC | PRN
  Administered 2020-09-04: 80 mL via INTRAVENOUS

## 2020-09-04 MED ORDER — HEPARIN SOD (PORK) LOCK FLUSH 100 UNIT/ML IV SOLN
INTRAVENOUS | Status: AC
  Filled 2020-09-04: qty 5

## 2020-09-05 ENCOUNTER — Ambulatory Visit: Payer: BLUE CROSS/BLUE SHIELD | Admitting: Oncology

## 2020-09-05 ENCOUNTER — Telehealth: Payer: Self-pay | Admitting: *Deleted

## 2020-09-05 ENCOUNTER — Inpatient Hospital Stay (HOSPITAL_BASED_OUTPATIENT_CLINIC_OR_DEPARTMENT_OTHER): Payer: BLUE CROSS/BLUE SHIELD | Admitting: Oncology

## 2020-09-05 DIAGNOSIS — C6292 Malignant neoplasm of left testis, unspecified whether descended or undescended: Secondary | ICD-10-CM

## 2020-09-05 DIAGNOSIS — Z95828 Presence of other vascular implants and grafts: Secondary | ICD-10-CM

## 2020-09-05 NOTE — Progress Notes (Signed)
Hematology and Oncology Follow Up for Telemedicine Visits  Cory Powell:[id number] 12/25/1990 30 y.o. 09/05/2020 2:45 PM [name], DOMatthews, [place], DO   I connected with Cory Powell on 09/05/20 at  3:30 PM EDT by telephone visit and verified that I am speaking with the correct person using two identifiers.   I discussed the limitations, risks, security and privacy concerns of performing an evaluation and management service by telemedicine and the availability of in-person appointments. I also discussed with the patient that there may be a patient responsible charge related to this service. The patient expressed understanding and agreed to proceed.  Other persons participating in the visit and their role in the encounter:    Patient's location: Home Provider's location: Office    Principle Diagnosis: 30 year old man with testicular cancer diagnosed in October 2021.  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       Prior Therapy:   He is status post orchiectomy completed on October 19, 2019.  With the final pathology showed 20% embryonal and 80% seminoma and that T2 lesion.   BEP chemotherapy started on November 15, 2019.  He completed 3 cycles of therapy on January 12, 2020.   Current therapy: Active surveillance.    Interim History: Mr. Tokuda returns supports and imaging changes in his health.  He reports no residual complications related to chemotherapy.  Denies any nausea, abdominal pain or worsening neuropathy.     Medications: I have reviewed the patient's current medications.  Current Outpatient Medications  Medication Sig Dispense Refill   lidocaine-prilocaine (EMLA) cream Apply 1 application topically as needed. 30 g 0   prochlorperazine (COMPAZINE) 10 MG tablet Take 1 tablet (10 mg total) by mouth every 6 (six) hours as needed for nausea or vomiting. 30 tablet 0   No current facility-administered  medications for this visit.     Allergies: No Known Allergies    Lab Results: Lab Results  Component Value Date   WBC 5.5 08/29/2020   HGB 13.9 08/29/2020   HCT 38.2 (L) 08/29/2020   MCV 85.1 08/29/2020   PLT 219 08/29/2020     Chemistry      Component Value Date/Time   NA 141 08/29/2020 1018   K 3.9 08/29/2020 1018   CL 105 08/29/2020 1018   CO2 27 08/29/2020 1018   BUN 26 (H) 08/29/2020 1018   CREATININE 1.32 (H) 08/29/2020 1018      Component Value Date/Time   CALCIUM 9.5 08/29/2020 1018   ALKPHOS 44 08/29/2020 1018   AST 31 08/29/2020 1018   ALT 28 08/29/2020 1018   BILITOT 0.6 08/29/2020 1018       Radiological Studies: CT CHEST ABDOMEN PELVIS W CONTRAST  Result Date: 09/05/2020 CLINICAL DATA:  History of testicular cancer, assess treatment response. EXAM: CT CHEST, ABDOMEN, AND PELVIS WITH CONTRAST TECHNIQUE: Multidetector CT imaging of the chest, abdomen and pelvis was performed following the standard protocol during bolus administration of intravenous contrast. CONTRAST:  91m OMNIPAQUE IOHEXOL 350 MG/ML SOLN COMPARISON:  CT February 09, 2020 and October 26, 2019. FINDINGS: CT CHEST FINDINGS Cardiovascular: Accessed right chest wall Port-A-Cath with tip in the right atrium. Normal size heart. No significant pericardial effusion/thickening. Normal caliber thoracic aorta and central pulmonary arteries. No central pulmonary embolus. Mediastinum/Nodes: No supraclavicular adenopathy. No discrete thyroid nodule. No pathologically enlarged mediastinal, hilar or axillary lymph nodes. The trachea and esophagus are unremarkable. Lungs/Pleura: No suspicious pulmonary nodules or  masses. No focal airspace consolidation. No pleural effusion. No pneumothorax. Musculoskeletal: No chest wall mass or suspicious bone lesions identified. CT ABDOMEN PELVIS FINDINGS Hepatobiliary: Tiny bilobar subcentimeter hypodensities are again visualized, appearing similar to prior and favored benign.  Pharyngeal cap on the gallbladder, with otherwise unremarkable appearance of the gallbladder. No biliary ductal dilation. Pancreas: Within normal limits. Spleen: Within normal limits. Adrenals/Urinary Tract: Adrenal glands are unremarkable. Kidneys are normal, without renal calculi, solid enhancing lesion, or hydronephrosis. Bladder is unremarkable. Stomach/Bowel: Stomach is within normal limits. Appendix is not definitely visualized however there is no pericecal inflammation. Terminal ileum appears normal. No evidence of bowel wall thickening, distention, or inflammatory changes. Vascular/Lymphatic: No abdominal aortic aneurysm or other significant vascular abnormality. Continued reduction in size of the left periaortic lymph nodes. No new or enlarging pathologic adenopathy identified. Index lymph nodes are as follows: - left periaortic lymph node measures 4 mm in short axis on image 71/2 previously 8 mm. - subjacent periaortic lymph node now measures 4 mm in short axis on image 74/2, previously 8 mm. Reproductive: Prostate glands unremarkable.  Prior left orchiectomy. Other: No abdominopelvic ascites. Musculoskeletal: No aggressive lytic or blastic lesion of bone. IMPRESSION: 1. Continued reduction in size of the left periaortic lymph nodes. No new or enlarging pathologic adenopathy identified. 2. No evidence of new or progressive disease in the chest, abdomen or pelvis. Electronically Signed   By: Dahlia Bailiff M.D.   On: 09/05/2020 10:35     Impression and Plan:  30 year old man with:   1.    Testicular cancer diagnosed in October 2021.  He was found to have stage III nonseminomatous tumor, 20% embryonal component and 80% seminoma.    His imaging studies as well as tumor markers were reviewed and showed no evidence of disease progression.  I recommended continued active surveillance with a repeat scan in 6 months.  Salvage chemotherapy will be used on disease progression at that time.   2.  Port-A-Cath management: Risks and benefits of Port-A-Cath removal discussed at this time.  He is in favor of removing it at this time.     3.  Pulmonary toxicity surveillance: No issues reported since last visit.     4.  Follow-up: In 6 months for repeat evaluation.  I discussed the assessment and treatment plan with the patient. The patient was provided an opportunity to ask questions and all were answered. The patient agreed with the plan and demonstrated an understanding of the instructions.   The patient was advised to call back or seek an in-person evaluation if the symptoms worsen or if the condition fails to improve as anticipated.  I provided 20 minutes of non face-to-face telephone visit time during this encounter.  Time was spent on updating disease status, discussing imaging studies, laboratory data testing and future plan of care discussion.   Zola Button, MD 09/05/2020 2:45 PM

## 2020-09-05 NOTE — Telephone Encounter (Signed)
Patient called - due to work emergency, he needs to cancel and reschedule appt with Dr. Alen Blew today. Dr. Alen Blew informed. Per Dr. Ileene Rubens let him know his his Ct and labs are normal. Can reschedule for phone appt" Patient informed. And verbalized understanding. Schedule message sent.

## 2020-09-19 ENCOUNTER — Other Ambulatory Visit: Payer: Self-pay

## 2020-09-19 ENCOUNTER — Ambulatory Visit (HOSPITAL_COMMUNITY)
Admission: RE | Admit: 2020-09-19 | Discharge: 2020-09-19 | Disposition: A | Discharge status: Home / Self Care | Payer: BLUE CROSS/BLUE SHIELD | Source: Ambulatory Visit | Attending: Oncology | Admitting: Oncology

## 2020-09-19 DIAGNOSIS — Z95828 Presence of other vascular implants and grafts: Secondary | ICD-10-CM

## 2020-09-19 DIAGNOSIS — Z452 Encounter for adjustment and management of vascular access device: Secondary | ICD-10-CM | POA: Diagnosis not present

## 2020-09-19 DIAGNOSIS — C6292 Malignant neoplasm of left testis, unspecified whether descended or undescended: Secondary | ICD-10-CM

## 2020-09-19 DIAGNOSIS — Z8547 Personal history of malignant neoplasm of testis: Secondary | ICD-10-CM | POA: Insufficient documentation

## 2020-09-19 HISTORY — PX: IR REMOVAL TUN ACCESS W/ PORT W/O FL MOD SED: IMG2290

## 2020-09-19 MED ORDER — LIDOCAINE-EPINEPHRINE 1 %-1:100000 IJ SOLN
INTRAMUSCULAR | Status: AC
  Administered 2020-09-19: 10 mL via SUBCUTANEOUS
  Filled 2020-09-19: qty 1

## 2020-09-19 NOTE — Progress Notes (Addendum)
Patient presents to IR today for port-a-cath removal with local anesthesia only.  He has completed his therapy.  Procedure, along with risks and benefits were discussed and all questions satisfactorily answered.   Pt is feeling well today and expresses no concerns. Consent signed/witnessed.   Electronically Signed: Pasty Spillers 09/19/2020, 10:00 AM

## 2020-09-19 NOTE — Procedures (Signed)
Vascular and Interventional Radiology Procedure Note  Patient: Cory Powell DOB: [birth year] Medical Record Number: [id number] Note Date/Time: 09/19/20 10:31 AM   Performing Physician: Michaelle Birks, MD Assistant(s): None  Diagnosis:  Testicular CA . Rx completion.  Procedure: PORT REMOVAL  Anesthesia: Local Anesthetic Complications: None Estimated Blood Loss: Minimal Specimens:  None  Findings:  Successful removal of a right-sided venous port. Primary incision closure. Dermabond at skin.  See detailed procedure note with images in PACS. The patient tolerated the procedure well without incident or complication and was returned to Recovery in stable condition.    Michaelle Birks, MD [facility]   Pager. [place]

## 2021-01-30 ENCOUNTER — Telehealth: Payer: Self-pay | Admitting: Oncology

## 2021-01-30 NOTE — Telephone Encounter (Signed)
Rescheduled March appointment time per scheduling error, patient has been called and voicemail was left.

## 2021-02-16 ENCOUNTER — Telehealth: Payer: Self-pay | Admitting: Oncology

## 2021-02-16 NOTE — Telephone Encounter (Signed)
Called patient regarding upcoming appointments, patient is notified. 

## 2021-02-27 ENCOUNTER — Encounter: Payer: Self-pay | Admitting: Oncology

## 2021-03-05 ENCOUNTER — Encounter: Payer: Self-pay | Admitting: Oncology

## 2021-03-06 ENCOUNTER — Inpatient Hospital Stay: Payer: 59 | Attending: Oncology

## 2021-03-06 ENCOUNTER — Other Ambulatory Visit: Payer: Self-pay

## 2021-03-06 DIAGNOSIS — C6292 Malignant neoplasm of left testis, unspecified whether descended or undescended: Secondary | ICD-10-CM | POA: Diagnosis not present

## 2021-03-06 LAB — CMP (CANCER CENTER ONLY)
ALT: 20 U/L (ref 0–44)
AST: 24 U/L (ref 15–41)
Albumin: 4.6 g/dL (ref 3.5–5.0)
Alkaline Phosphatase: 42 U/L (ref 38–126)
Anion gap: 6 (ref 5–15)
BUN: 26 mg/dL — ABNORMAL HIGH (ref 6–20)
CO2: 29 mmol/L (ref 22–32)
Calcium: 9.4 mg/dL (ref 8.9–10.3)
Chloride: 103 mmol/L (ref 98–111)
Creatinine: 1.07 mg/dL (ref 0.61–1.24)
GFR, Estimated: >60 mL/min (ref >60)
Glucose, Bld: 123 mg/dL — ABNORMAL HIGH (ref 70–99)
Potassium: 3.9 mmol/L (ref 3.5–5.1)
Sodium: 138 mmol/L (ref 135–145)
Total Bilirubin: 1 mg/dL (ref 0.3–1.2)
Total Protein: 7.5 g/dL (ref 6.5–8.1)

## 2021-03-06 LAB — CBC WITH DIFFERENTIAL (CANCER CENTER ONLY)
Abs Immature Granulocytes: 0.01 10*3/uL (ref 0.00–0.07)
Basophils Absolute: 0 10*3/uL (ref 0.0–0.1)
Basophils Relative: 1 %
Eosinophils Absolute: 0.1 10*3/uL (ref 0.0–0.5)
Eosinophils Relative: 2 %
HCT: 39.3 % (ref 39.0–52.0)
Hemoglobin: 14.1 g/dL (ref 13.0–17.0)
Immature Granulocytes: 0 %
Lymphocytes Relative: 13 %
Lymphs Abs: 1.1 10*3/uL (ref 0.7–4.0)
MCH: 30.2 pg (ref 26.0–34.0)
MCHC: 35.9 g/dL (ref 30.0–36.0)
MCV: 84.2 fL (ref 80.0–100.0)
Monocytes Absolute: 0.5 10*3/uL (ref 0.1–1.0)
Monocytes Relative: 6 %
Neutro Abs: 6.9 10*3/uL (ref 1.7–7.7)
Neutrophils Relative %: 78 %
Platelet Count: 230 10*3/uL (ref 150–400)
RBC: 4.67 MIL/uL (ref 4.22–5.81)
RDW: 11.5 % (ref 11.5–15.5)
WBC Count: 8.7 10*3/uL (ref 4.0–10.5)
nRBC: 0 % (ref 0.0–0.2)

## 2021-03-06 LAB — LACTATE DEHYDROGENASE: LDH: 174 U/L (ref 98–192)

## 2021-03-07 LAB — AFP TUMOR MARKER: AFP, Serum, Tumor Marker: 4.7 ng/mL (ref 0.0–6.9)

## 2021-03-07 LAB — BETA HCG QUANT (REF LAB): hCG Quant: <1 m[IU]/mL (ref 0–3)

## 2021-03-09 ENCOUNTER — Ambulatory Visit (HOSPITAL_COMMUNITY)
Admission: RE | Admit: 2021-03-09 | Discharge: 2021-03-09 | Disposition: A | Discharge status: Home / Self Care | Payer: 59 | Source: Ambulatory Visit | Attending: Oncology | Admitting: Oncology

## 2021-03-09 ENCOUNTER — Other Ambulatory Visit: Payer: Self-pay

## 2021-03-09 DIAGNOSIS — C6292 Malignant neoplasm of left testis, unspecified whether descended or undescended: Secondary | ICD-10-CM | POA: Diagnosis present

## 2021-03-09 MED ORDER — IOHEXOL 300 MG/ML  SOLN
100.0000 mL | Freq: Once | INTRAMUSCULAR | Status: AC | PRN
  Administered 2021-03-09: 100 mL via INTRAVENOUS

## 2021-03-09 MED ORDER — SODIUM CHLORIDE (PF) 0.9 % IJ SOLN
INTRAMUSCULAR | Status: AC
  Filled 2021-03-09: qty 50

## 2021-03-13 ENCOUNTER — Ambulatory Visit: Payer: BLUE CROSS/BLUE SHIELD | Admitting: Oncology

## 2021-03-15 ENCOUNTER — Inpatient Hospital Stay: Payer: 59 | Attending: Oncology | Admitting: Oncology

## 2021-03-15 ENCOUNTER — Other Ambulatory Visit: Payer: Self-pay

## 2021-03-15 VITALS — BP 120/76 | HR 77 | Temp 97.7°F | Resp 17 | Wt 170.1 lb

## 2021-03-15 DIAGNOSIS — Z8547 Personal history of malignant neoplasm of testis: Secondary | ICD-10-CM | POA: Insufficient documentation

## 2021-03-15 DIAGNOSIS — C6292 Malignant neoplasm of left testis, unspecified whether descended or undescended: Secondary | ICD-10-CM

## 2021-03-15 NOTE — Progress Notes (Signed)
Hematology and Oncology Follow Up ? ?OLLIVANDER SEE ?[id number] ?Oct 24, 1990 31 y.o. ?03/15/2021 2:57 PM ?[name], DOMat[place]ws, [place], DO  ? ? ? ? ?Principle Diagnosis: 31 year old man with T2N2 left testicular cancer diagnosed in October 2021.  He was found to have nonseminomatous tumor with 20% embryonal and 80% seminoma.   ?  ?  ?Prior Therapy: ?  ?He is status post orchiectomy completed on October 19, 2019.  With the final pathology showed 20% embryonal and 80% seminoma and that T2 lesion. ?  ?BEP chemotherapy started on November 15, 2019.  He completed 3 cycles of therapy on January 12, 2020. ?  ?Current therapy: Active surveillance. ?  ? ?Interim History: Mr. Mabry presents today for a follow-up visit.  Since the last visit, he reports no major changes in his health.  He denies any recent hospitalizations or illnesses.  He denies any nausea, vomiting or shortness of breath.  He denies any diarrhea or changes in his bowel habits.  His performance status and quality of life remain excellent. ? ? ? ?Medications: Updated on review. ?Current Outpatient Medications  ?Medication Sig Dispense Refill  ? lidocaine-prilocaine (EMLA) cream Apply 1 application topically as needed. 30 g 0  ? prochlorperazine (COMPAZINE) 10 MG tablet Take 1 tablet (10 mg total) by mouth every 6 (six) hours as needed for nausea or vomiting. 30 tablet 0  ? ?No current facility-administered medications for this visit.  ? ? ? ?Allergies: No Known Allergies ? ?Physical exam: ?Blood pressure 120/76, pulse 77, temperature 97.7 ?F (36.5 ?C), temperature source Temporal, resp. rate 17, weight 170 lb 1 oz (77.1 kg), SpO2 100 %. ? ?ECOG 0 ? ?General appearance: Comfortable appearing without any discomfort ?Head: Normocephalic without any trauma ?Oropharynx: Mucous membranes are moist and pink without any thrush or ulcers. ?Eyes: Pupils are equal and round reactive to light. ?Lymph nodes: No cervical, supraclavicular, inguinal or axillary  lymphadenopathy.   ?Heart:regular rate and rhythm.  S1 and S2 without leg edema. ?Lung: Clear without any rhonchi or wheezes.  No dullness to percussion. ?Abdomin: Soft, nontender, nondistended with good bowel sounds.  No hepatosplenomegaly. ?Musculoskeletal: No joint deformity or effusion.  Full range of motion noted. ?Neurological: No deficits noted on motor, sensory and deep tendon reflex exam. ?Skin: No petechial rash or dryness.  Appeared moist.  ?Psychiatric: Mood and affect appeared appropriate. ? ? ?Lab Results: ?Lab Results  ?Component Value Date  ? WBC 8.7 03/06/2021  ? HGB 14.1 03/06/2021  ? HCT 39.3 03/06/2021  ? MCV 84.2 03/06/2021  ? PLT 230 03/06/2021  ? ?  Chemistry   ?   ?Component Value Date/Time  ? NA 138 03/06/2021 1105  ? K 3.9 03/06/2021 1105  ? CL 103 03/06/2021 1105  ? CO2 29 03/06/2021 1105  ? BUN 26 (H) 03/06/2021 1105  ? CREATININE 1.07 03/06/2021 1105  ?    ?Component Value Date/Time  ? CALCIUM 9.4 03/06/2021 1105  ? ALKPHOS 42 03/06/2021 1105  ? AST 24 03/06/2021 1105  ? ALT 20 03/06/2021 1105  ? BILITOT 1.0 03/06/2021 1105  ?  ? ? Latest Reference Range & Units 03/06/21 11:05  ?hCG Quant 0 - 3 mIU/mL <1  ?AFP, Serum, Tumor Marker 0.0 - 6.9 ng/mL 4.7  ? ? ?IMPRESSION: ?Stable examination including mildly prominent retroperitoneal lymph ?nodes without evidence of new or progressive disease in the chest, ?abdomen or pelvis to suggest recurrence or active metastatic ?disease. ? ? ? ?Impression and Plan: ? ?31 year old man with: ?  ?  1.   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  He was found to have 80% seminoma with 20% embryonal tumor. ? ?He continues to be in remission at this time after 3 cycles of BEP chemotherapy and currently on active surveillance.  CT scan and tumor markers obtained on June 09, 2021 were personally reviewed and discussed with the patient today.  He continues to show no evidence of disease relapse at this time. ? ?The plan is to  continue with imaging studies every 6 months for the next year and annually after that.  Therapy options including multiagent chemotherapy versus surgery could be considered if he developed relapsed disease. ? ? ? ? ?  ?2. Port-A-Cath management: Removed without any complications at this time. ?  ?  ?3.  Pulmonary toxicity surveillance: He does not have any pulmonary issues at this time.  No residual complications related to bleomycin exposure. ? ?4.  Survivorship issues and health maintenance: We will continue to emphasize the importance of age-appropriate health maintenance and cancer screening.  He is up-to-date at this time. ?  ?  ?5.  Follow-up: In 6 months for repeat evaluation. ? ?30  minutes were dedicated to this visit. The time was spent on reviewing laboratory data, imaging studies, addressing complications related to cancer and cancer therapy and future plan of care reviewed. ?Zola Button, MD 03/15/2021 2:57 PM ? ?

## 2021-05-01 IMAGING — CT CT CHEST-ABD-PELV W/ CM
2 of 4 series · 14 of 36 positions shown, 16 images · IV contrast (APPLIED)
Comparison: 10/26/2019

CLINICAL DATA: Testicular cancer restaging

EXAM:
CT CHEST, ABDOMEN, AND PELVIS WITH CONTRAST
TECHNIQUE: Multidetector CT imaging of the chest, abdomen and pelvis was
performed following the standard protocol during bolus
administration of intravenous contrast.
CONTRAST:  100mL OMNIPAQUE IOHEXOL 300 MG/ML  SOLN

[Series 2: cap with · axial · 0.71mm/px · z∈[-573,+27]mm · 11 of 146 slices shown, 13 images]
[im 13/146  mediastinal]
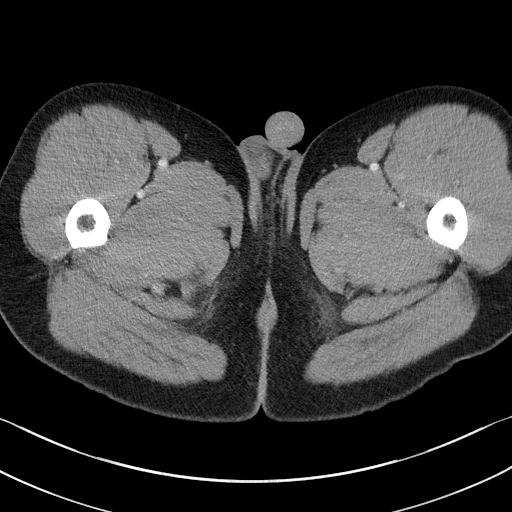
[im 13/146  bone]
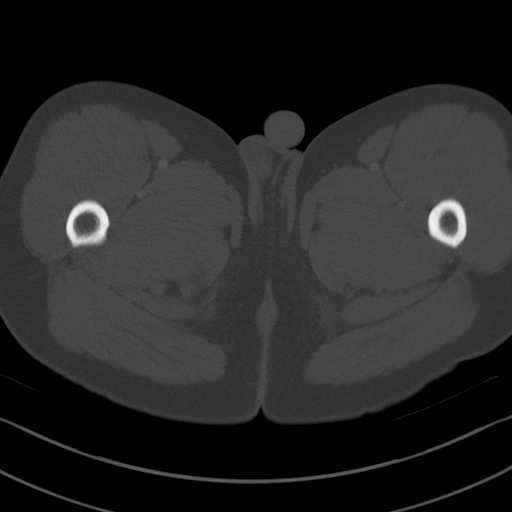
[im 25/146  mediastinal]
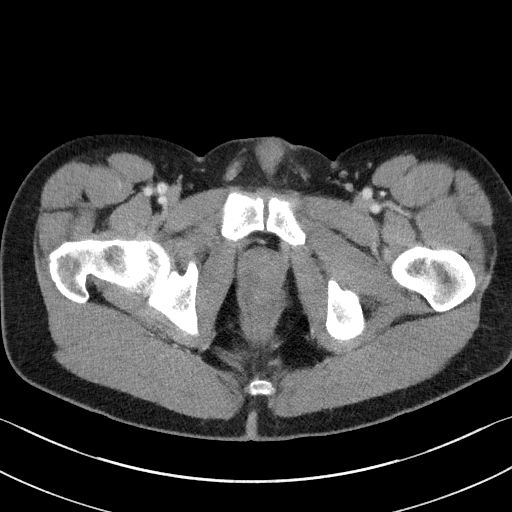
[im 37/146  mediastinal]
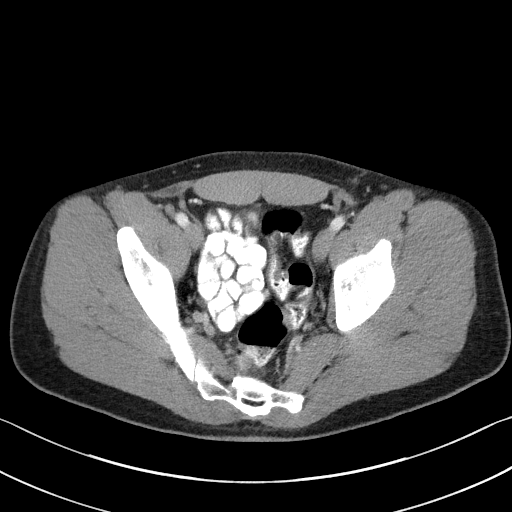
[im 49/146  mediastinal]
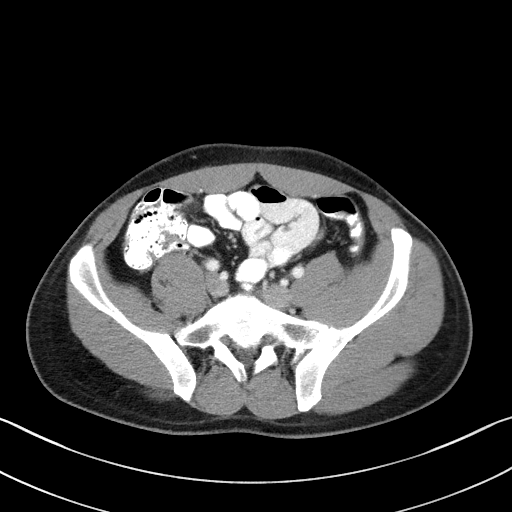
[im 61/146  mediastinal]
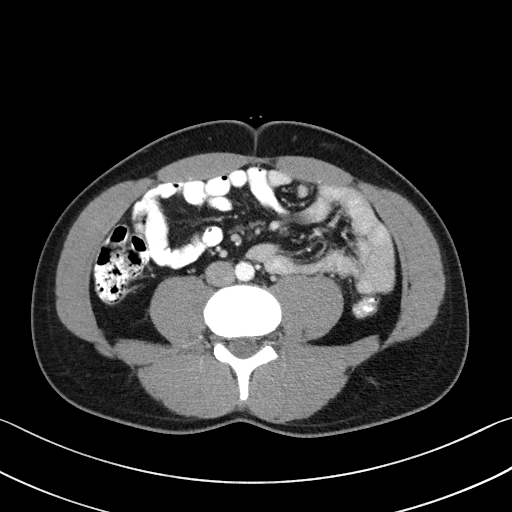
[im 73/146  mediastinal]
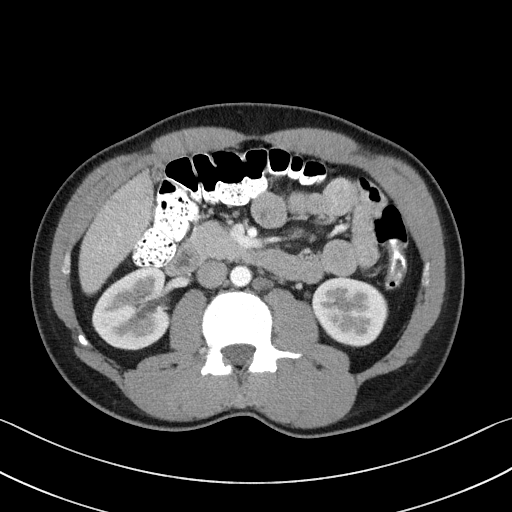
[im 85/146  mediastinal]
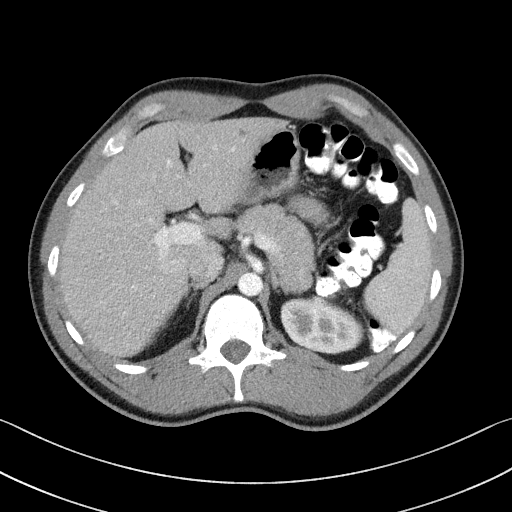
[im 97/146  mediastinal]
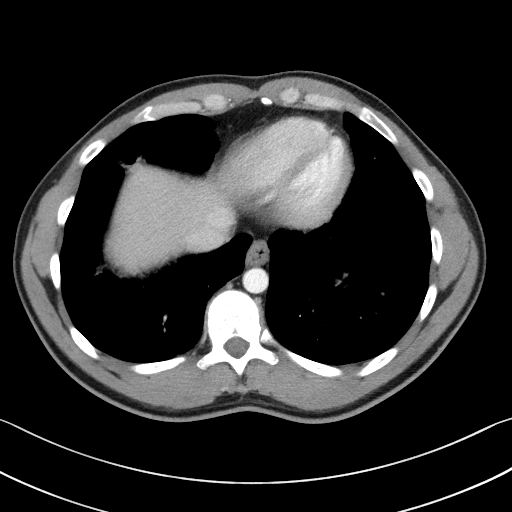
[im 109/146  mediastinal]
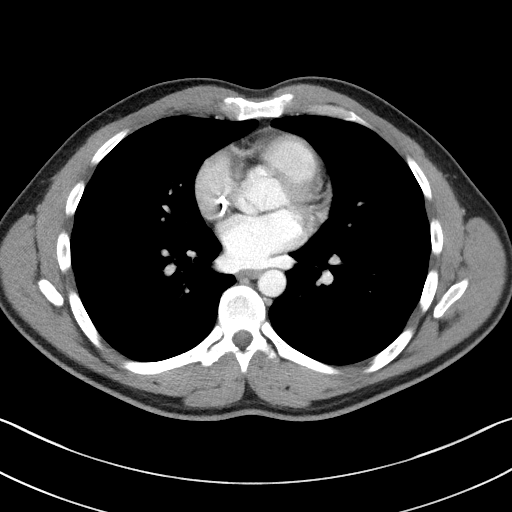
[im 109/146  bone]
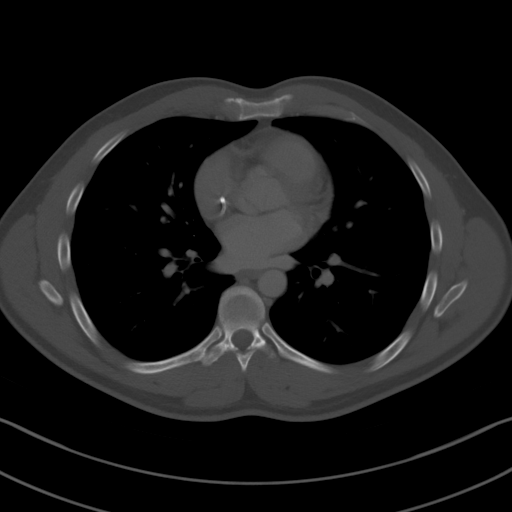
[im 121/146  mediastinal]
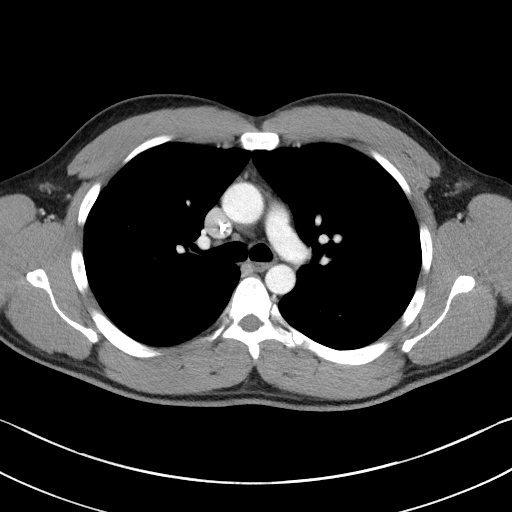
[im 133/146  mediastinal]
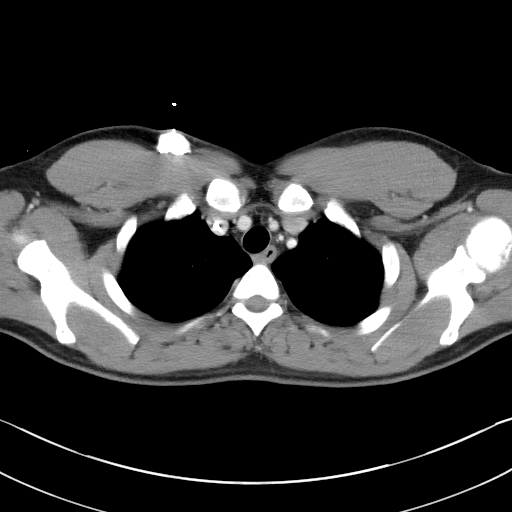

[Series 5: coronals · coronal · 0.76mm/px · 3 of 142 slices shown]
[im 29/142  mediastinal]
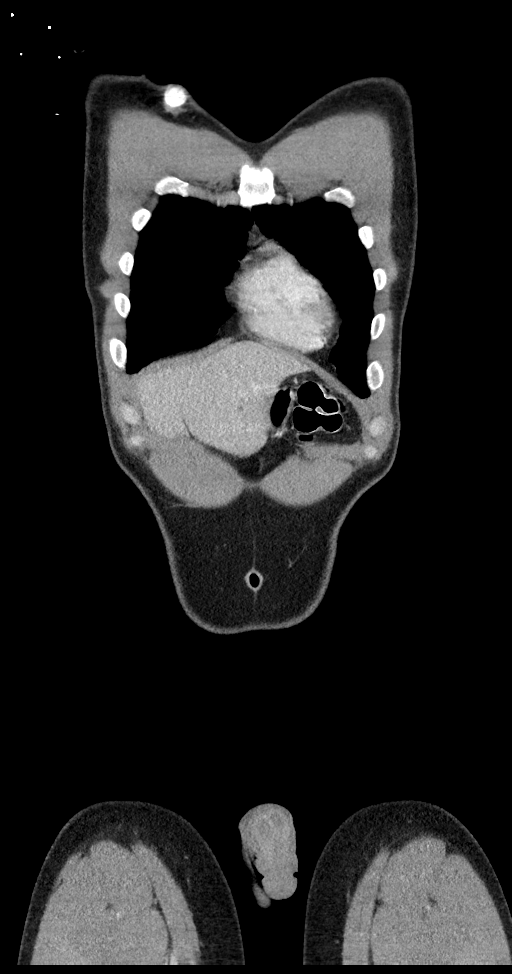
[im 57/142  mediastinal]
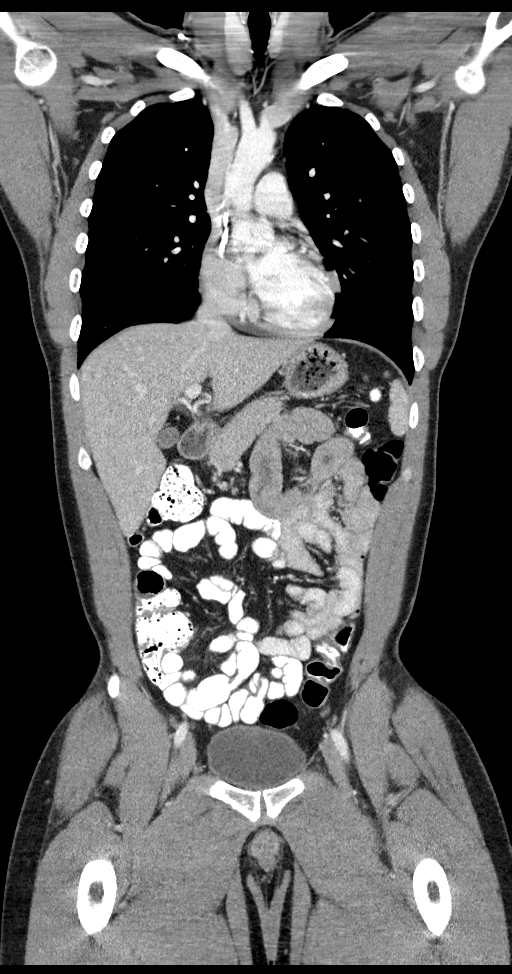
[im 85/142  mediastinal]
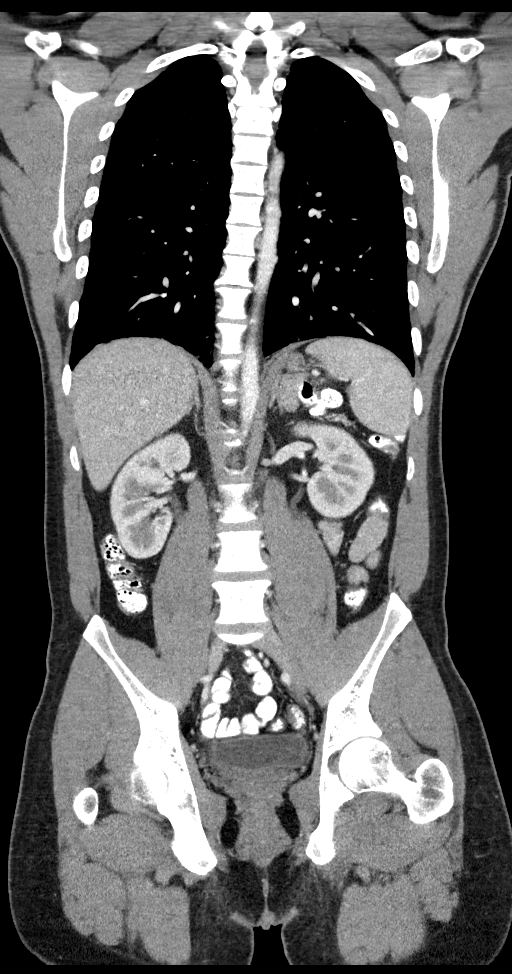

[14 of 36 positions shown; findings below may reference images not displayed]

FINDINGS: CT CHEST FINDINGS

Cardiovascular: Right Port-A-Cath tip: Right atrium.

Mediastinum/Nodes: Small type 1 hiatal hernia.

Lungs/Pleura: Indistinct mild airspace opacities along the lung
bases are new compared to 10/26/2019, and likely primarily from
atelectasis with atypical infectious process a less likely
differential diagnostic consideration given the sparing of the rest
of the lungs. No pleural effusion.

Musculoskeletal: Unremarkable

CT ABDOMEN PELVIS FINDINGS

Hepatobiliary: Contracted gallbladder. Tiny hypodensities in the
liver are unchanged and likely benign/incidental.

Pancreas: Unremarkable

Spleen: Unremarkable

Adrenals/Urinary Tract: Unremarkable

Stomach/Bowel: Unremarkable

Vascular/Lymphatic: Left periaortic lymph node 0.8 cm in short axis
on image 72 of series 2, previously 2.2 cm on 10/26/2019. An
adjacent left periaortic lymph node measures 0.8 cm on image 74 of
series 2, formerly 2.1 cm. No new or enlarging pathologic
adenopathy.

Reproductive: Left orchectomy.

Other: No supplemental non-categorized findings.

Musculoskeletal: Unremarkable
IMPRESSION: 1. Substantial reduction in size of the left periaortic lymph nodes,
currently 0.8 cm in short axis. No new or enlarging pathologic
adenopathy identified.
2. Indistinct mild airspace opacities along the lung bases are new
compared to 10/26/2019, and likely primarily from atelectasis with
atypical infectious process a less likely differential diagnostic
consideration given the sparing of the rest of the lungs.
3. Small type 1 hiatal hernia.

## 2021-09-04 ENCOUNTER — Telehealth: Payer: Self-pay | Admitting: *Deleted

## 2021-09-04 NOTE — Telephone Encounter (Signed)
MyChart message sent to patient.

## 2021-09-17 ENCOUNTER — Other Ambulatory Visit: Payer: 59

## 2021-09-19 ENCOUNTER — Ambulatory Visit (HOSPITAL_COMMUNITY)
Admission: RE | Admit: 2021-09-19 | Discharge: 2021-09-19 | Disposition: A | Discharge status: Home / Self Care | Payer: 59 | Source: Ambulatory Visit | Attending: Oncology | Admitting: Oncology

## 2021-09-19 ENCOUNTER — Inpatient Hospital Stay: Payer: 59 | Attending: Oncology

## 2021-09-19 DIAGNOSIS — C6292 Malignant neoplasm of left testis, unspecified whether descended or undescended: Secondary | ICD-10-CM

## 2021-09-19 DIAGNOSIS — Z8547 Personal history of malignant neoplasm of testis: Secondary | ICD-10-CM | POA: Diagnosis present

## 2021-09-19 DIAGNOSIS — Z9079 Acquired absence of other genital organ(s): Secondary | ICD-10-CM | POA: Insufficient documentation

## 2021-09-19 LAB — CMP (CANCER CENTER ONLY)
ALT: 24 U/L (ref 0–44)
AST: 28 U/L (ref 15–41)
Albumin: 4.6 g/dL (ref 3.5–5.0)
Alkaline Phosphatase: 39 U/L (ref 38–126)
Anion gap: 8 (ref 5–15)
BUN: 17 mg/dL (ref 6–20)
CO2: 27 mmol/L (ref 22–32)
Calcium: 9.6 mg/dL (ref 8.9–10.3)
Chloride: 105 mmol/L (ref 98–111)
Creatinine: 0.95 mg/dL (ref 0.61–1.24)
GFR, Estimated: >60 mL/min (ref >60)
Glucose, Bld: 98 mg/dL (ref 70–99)
Potassium: 4 mmol/L (ref 3.5–5.1)
Sodium: 140 mmol/L (ref 135–145)
Total Bilirubin: 0.6 mg/dL (ref 0.3–1.2)
Total Protein: 7.7 g/dL (ref 6.5–8.1)

## 2021-09-19 LAB — CBC WITH DIFFERENTIAL (CANCER CENTER ONLY)
Abs Immature Granulocytes: 0.02 10*3/uL (ref 0.00–0.07)
Basophils Absolute: 0 10*3/uL (ref 0.0–0.1)
Basophils Relative: 1 %
Eosinophils Absolute: 0.2 10*3/uL (ref 0.0–0.5)
Eosinophils Relative: 3 %
HCT: 39.2 % (ref 39.0–52.0)
Hemoglobin: 13.8 g/dL (ref 13.0–17.0)
Immature Granulocytes: 0 %
Lymphocytes Relative: 30 %
Lymphs Abs: 1.9 10*3/uL (ref 0.7–4.0)
MCH: 30.3 pg (ref 26.0–34.0)
MCHC: 35.2 g/dL (ref 30.0–36.0)
MCV: 86.2 fL (ref 80.0–100.0)
Monocytes Absolute: 0.4 10*3/uL (ref 0.1–1.0)
Monocytes Relative: 7 %
Neutro Abs: 3.9 10*3/uL (ref 1.7–7.7)
Neutrophils Relative %: 59 %
Platelet Count: 218 10*3/uL (ref 150–400)
RBC: 4.55 MIL/uL (ref 4.22–5.81)
RDW: 11.8 % (ref 11.5–15.5)
WBC Count: 6.5 10*3/uL (ref 4.0–10.5)
nRBC: 0 % (ref 0.0–0.2)

## 2021-09-19 LAB — LACTATE DEHYDROGENASE: LDH: 163 U/L (ref 98–192)

## 2021-09-19 MED ORDER — IOHEXOL 300 MG/ML  SOLN
100.0000 mL | Freq: Once | INTRAMUSCULAR | Status: AC | PRN
  Administered 2021-09-19: 100 mL via INTRAVENOUS

## 2021-09-19 MED ORDER — SODIUM CHLORIDE (PF) 0.9 % IJ SOLN
INTRAMUSCULAR | Status: AC
  Filled 2021-09-19: qty 50

## 2021-09-21 LAB — AFP TUMOR MARKER: AFP, Serum, Tumor Marker: 4.9 ng/mL (ref 0.0–6.9)

## 2021-09-21 LAB — BETA HCG QUANT (REF LAB): hCG Quant: <1 m[IU]/mL (ref 0–3)

## 2021-09-25 ENCOUNTER — Other Ambulatory Visit: Payer: Self-pay

## 2021-09-25 ENCOUNTER — Inpatient Hospital Stay: Payer: 59 | Admitting: Oncology

## 2021-09-25 VITALS — BP 119/80 | HR 89 | Temp 99.1°F | Resp 15 | Ht 66.0 in | Wt 170.6 lb

## 2021-09-25 DIAGNOSIS — Z8547 Personal history of malignant neoplasm of testis: Secondary | ICD-10-CM | POA: Diagnosis not present

## 2021-09-25 DIAGNOSIS — C6292 Malignant neoplasm of left testis, unspecified whether descended or undescended: Secondary | ICD-10-CM | POA: Diagnosis not present

## 2021-09-25 NOTE — Progress Notes (Signed)
Hematology and Oncology Follow Up  Cory Powell [id number] January 03, 1991 31 y.o. 09/25/2021 2:56 PM Cory Powell, DOMatthews, [place], DO      Principle Diagnosis: 31 year old man with testicular cancer diagnosed in October 2021.  He was found to have T2N2 left nonseminomatous tumor with 20% embryonal and 80% seminoma as well as pelvic adenopathy.     Prior Therapy:   He is status post orchiectomy completed on October 19, 2019.  With the final pathology showed 20% embryonal and 80% seminoma and that T2 lesion.   BEP chemotherapy started on November 15, 2019.  He completed 3 cycles of therapy on January 12, 2020.   Current therapy: Active surveillance.    Interim History: Mr. Cory Powell returns today for a follow-up evaluation.  Since the last visit, he reports no major changes in his health.  He denies any residual complications related to chemotherapy.  He denies any nausea, vomiting or abdominal pain.  He denies any hospitalizations or illnesses.  Formal status quality of life remains unchanged.    Medications: Updated without changes. Current Outpatient Medications  Medication Sig Dispense Refill   lidocaine-prilocaine (EMLA) cream Apply 1 application topically as needed. 30 g 0   prochlorperazine (COMPAZINE) 10 MG tablet Take 1 tablet (10 mg total) by mouth every 6 (six) hours as needed for nausea or vomiting. 30 tablet 0   No current facility-administered medications for this visit.     Allergies: No Known Allergies  Physical exam: Blood pressure 119/80, pulse 89, temperature 99.1 F (37.3 C), temperature source Temporal, resp. rate 15, height '5\' 6"'$  (1.676 m), weight 170 lb 9.6 oz (77.4 kg), SpO2 99 %.   ECOG 0    General appearance: Alert, awake without any distress. Head: Atraumatic without abnormalities Oropharynx: Without any thrush or ulcers. Eyes: No scleral icterus. Lymph nodes: No lymphadenopathy noted in the cervical, supraclavicular, or axillary  nodes Heart:regular rate and rhythm, without any murmurs or gallops.   Lung: Clear to auscultation without any rhonchi, wheezes or dullness to percussion. Abdomin: Soft, nontender without any shifting dullness or ascites. Musculoskeletal: No clubbing or cyanosis. Neurological: No motor or sensory deficits. Skin: No rashes or lesions.   Lab Results: Lab Results  Component Value Date   WBC 6.5 09/19/2021   HGB 13.8 09/19/2021   HCT 39.2 09/19/2021   MCV 86.2 09/19/2021   PLT 218 09/19/2021     Chemistry      Component Value Date/Time   NA 140 09/19/2021 1434   K 4.0 09/19/2021 1434   CL 105 09/19/2021 1434   CO2 27 09/19/2021 1434   BUN 17 09/19/2021 1434   CREATININE 0.95 09/19/2021 1434      Component Value Date/Time   CALCIUM 9.6 09/19/2021 1434   ALKPHOS 39 09/19/2021 1434   AST 28 09/19/2021 1434   ALT 24 09/19/2021 1434   BILITOT 0.6 09/19/2021 1434     EXAM: CT CHEST, ABDOMEN, AND PELVIS WITH CONTRAST   TECHNIQUE: Multidetector CT imaging of the chest, abdomen and pelvis was performed following the standard protocol during bolus administration of intravenous contrast.   RADIATION DOSE REDUCTION: This exam was performed according to the departmental dose-optimization program which includes automated exposure control, adjustment of the mA and/or kV according to patient size and/or use of iterative reconstruction technique.   CONTRAST:  161m OMNIPAQUE IOHEXOL 300 MG/ML  SOLN   COMPARISON:  CT 10/26/2019, 03/09/2021.   FINDINGS: CT CHEST FINDINGS   Cardiovascular: No significant vascular findings. Normal heart  size. No pericardial effusion.   Mediastinum/Nodes: No anterior mediastinal mass. No mediastinal adenopathy. No hilar adenopathy.   Lungs/Pleura: No suspicious pulmonary nodules. Normal pleural. Airways normal.   Musculoskeletal: No aggressive osseous lesion.   CT ABDOMEN AND PELVIS FINDINGS   Hepatobiliary: Tiny hypodensity in the lateral  segment of the LEFT hepatic lobe (image 60/2) is not changed from CT 2021. Similar lesion in the posterior RIGHT hepatic lobe on image 63/2 is also unchanged.   Pancreas: Pancreas is normal. No ductal dilatation. No pancreatic inflammation.   Spleen: Normal spleen   Adrenals/urinary tract: Adrenal glands and kidneys are normal. The ureters and bladder normal.   Stomach/Bowel: Stomach, small bowel, appendix, and cecum are normal. The colon and rectosigmoid colon are normal.   Vascular/Lymphatic: Abdominal aorta normal caliber.   No retroperitoneal lymphadenopathy. The retrocrural adenopathy. No iliac adenopathy. No inguinal adenopathy.   Reproductive: Prostate unremarkable.  Post LEFT orchiectomy.   Other: No peritoneal disease   Musculoskeletal: No aggressive osseous lesion.   IMPRESSION: Chest Impression:   1. No evidence of mediastinal or pulmonary metastasis.   Abdomen / Pelvis Impression:   1. No retroperitoneal lymphadenopathy. 2. No evidence testicular carcinoma metastasis. 3. Post LEFT orchiectomy.         Impression and Plan:  31 year old man with:   1.   Left testicular cancer diagnosed in October 2021.  He was found to have stage III nonseminomatous tumor with 80% seminoma with 20% embryonal in addition to pelvic adenopathy.  He is currently on active surveillance without any evidence of relapse.  Laboratory testing on 09/19/2021 including tumor markers as well as imaging studies were personally reviewed and all within normal range.  No evidence of metastatic disease detected.  Therapy options including systemic chemotherapy with radiation will be deferred unless he has relapsed disease.  I recommended repeat imaging studies in 6 months and then annually to complete 4 years.      2.  Survivorship issues and health maintenance: He remains up-to-date on health screening and immunization.  We have discussed long-term complication associated with platinum  therapy including cardiovascular issues as well as bone marrow diseases.     3.  Follow-up: In 6 months for repeat evaluation.  30  minutes were spent on this encounter.  The time was dedicated to reviewing laboratory data, disease status update and outlining future plan of care discussion.   Zola Button, MD 09/25/2021 2:56 PM

## 2022-01-23 ENCOUNTER — Telehealth: Payer: Self-pay | Admitting: Oncology

## 2022-01-23 NOTE — Telephone Encounter (Signed)
Called patient per Dr. Alen Blew transition. Left voicemail for patient to call us back.

## 2022-03-19 ENCOUNTER — Other Ambulatory Visit: Payer: 59

## 2022-03-26 ENCOUNTER — Ambulatory Visit: Payer: 59 | Admitting: Hematology

## 2022-03-26 ENCOUNTER — Other Ambulatory Visit: Payer: 59

## 2022-04-02 ENCOUNTER — Ambulatory Visit: Payer: 59 | Admitting: Oncology

## 2022-04-11 ENCOUNTER — Other Ambulatory Visit: Payer: Self-pay

## 2022-04-11 ENCOUNTER — Telehealth: Payer: Self-pay | Admitting: *Deleted

## 2022-04-11 NOTE — Telephone Encounter (Signed)
Former patient of Dr Alen Blew is requesting a referral to [facility] for follow up as he lives in [place]. Is on active surveillance.

## 2022-04-12 ENCOUNTER — Other Ambulatory Visit: Payer: Self-pay | Admitting: *Deleted

## 2022-04-12 DIAGNOSIS — C6292 Malignant neoplasm of left testis, unspecified whether descended or undescended: Secondary | ICD-10-CM

## 2022-04-15 ENCOUNTER — Telehealth: Payer: Self-pay | Admitting: *Deleted

## 2022-04-15 NOTE — Telephone Encounter (Signed)
Referral and office notes faxed to [facility] Oncology clinic
